# Patient Record
Sex: Female | Born: 1955 | Race: White | Hispanic: No | Marital: Married | Smoking: Never smoker
Health system: Southern US, Community
[De-identification: ages and names within clinical notes are randomized; demographics above are authoritative.]

## PROBLEM LIST (undated history)

## (undated) DIAGNOSIS — K219 Gastro-esophageal reflux disease without esophagitis: Secondary | ICD-10-CM

## (undated) DIAGNOSIS — E039 Hypothyroidism, unspecified: Secondary | ICD-10-CM

## (undated) HISTORY — PX: UPPER GASTROINTESTINAL ENDOSCOPY: SHX188

## (undated) HISTORY — DX: Gastro-esophageal reflux disease without esophagitis: K21.9

---
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
## 1999-06-22 ENCOUNTER — Other Ambulatory Visit: Admission: RE | Admit: 1999-06-22 | Discharge: 1999-06-22 | Payer: Self-pay | Admitting: Gynecology

## 1999-10-07 ENCOUNTER — Emergency Department (HOSPITAL_COMMUNITY): Admission: EM | Admit: 1999-10-07 | Discharge: 1999-10-07 | Payer: Self-pay | Admitting: Emergency Medicine

## 1999-10-08 ENCOUNTER — Encounter: Payer: Self-pay | Admitting: Emergency Medicine

## 1999-10-08 ENCOUNTER — Encounter: Payer: Self-pay | Admitting: *Deleted

## 1999-10-09 ENCOUNTER — Ambulatory Visit (HOSPITAL_COMMUNITY): Admission: RE | Admit: 1999-10-09 | Discharge: 1999-10-09 | Payer: Self-pay | Admitting: Specialist

## 1999-10-09 ENCOUNTER — Encounter: Payer: Self-pay | Admitting: Specialist

## 2000-02-16 ENCOUNTER — Emergency Department (HOSPITAL_COMMUNITY): Admission: EM | Admit: 2000-02-16 | Discharge: 2000-02-16 | Payer: Self-pay | Admitting: Emergency Medicine

## 2000-02-16 ENCOUNTER — Encounter: Payer: Self-pay | Admitting: Emergency Medicine

## 2000-08-02 ENCOUNTER — Other Ambulatory Visit: Admission: RE | Admit: 2000-08-02 | Discharge: 2000-08-02 | Payer: Self-pay | Admitting: Gynecology

## 2000-09-20 HISTORY — PX: WRIST FRACTURE SURGERY: SHX121

## 2000-12-28 ENCOUNTER — Encounter: Payer: Self-pay | Admitting: Family Medicine

## 2000-12-28 ENCOUNTER — Encounter: Admission: RE | Admit: 2000-12-28 | Discharge: 2000-12-28 | Payer: Self-pay | Admitting: Family Medicine

## 2001-10-09 ENCOUNTER — Other Ambulatory Visit: Admission: RE | Admit: 2001-10-09 | Discharge: 2001-10-09 | Payer: Self-pay | Admitting: Gynecology

## 2002-06-11 ENCOUNTER — Ambulatory Visit (HOSPITAL_COMMUNITY): Admission: RE | Admit: 2002-06-11 | Discharge: 2002-06-11 | Payer: Self-pay | Admitting: Internal Medicine

## 2003-05-21 ENCOUNTER — Encounter: Payer: Self-pay | Admitting: *Deleted

## 2003-05-21 ENCOUNTER — Emergency Department (HOSPITAL_COMMUNITY): Admission: EM | Admit: 2003-05-21 | Discharge: 2003-05-22 | Payer: Self-pay | Admitting: *Deleted

## 2003-06-10 ENCOUNTER — Other Ambulatory Visit: Admission: RE | Admit: 2003-06-10 | Discharge: 2003-06-10 | Payer: Self-pay | Admitting: Gynecology

## 2004-09-24 ENCOUNTER — Other Ambulatory Visit: Admission: RE | Admit: 2004-09-24 | Discharge: 2004-09-24 | Payer: Self-pay | Admitting: Gynecology

## 2005-02-08 ENCOUNTER — Encounter: Admission: RE | Admit: 2005-02-08 | Discharge: 2005-02-08 | Payer: Self-pay | Admitting: Internal Medicine

## 2005-02-11 ENCOUNTER — Encounter: Admission: RE | Admit: 2005-02-11 | Discharge: 2005-02-11 | Payer: Self-pay | Admitting: Internal Medicine

## 2006-07-07 ENCOUNTER — Other Ambulatory Visit: Admission: RE | Admit: 2006-07-07 | Discharge: 2006-07-07 | Payer: Self-pay | Admitting: Gynecology

## 2006-07-13 ENCOUNTER — Encounter: Admission: RE | Admit: 2006-07-13 | Discharge: 2006-07-13 | Payer: Self-pay | Admitting: Gynecology

## 2007-01-18 ENCOUNTER — Other Ambulatory Visit: Admission: RE | Admit: 2007-01-18 | Discharge: 2007-01-18 | Payer: Self-pay | Admitting: Gynecology

## 2007-02-08 ENCOUNTER — Ambulatory Visit (HOSPITAL_COMMUNITY): Admission: RE | Admit: 2007-02-08 | Discharge: 2007-02-08 | Payer: Self-pay | Admitting: Otolaryngology

## 2007-03-17 ENCOUNTER — Ambulatory Visit: Payer: Self-pay | Admitting: Cardiovascular Disease

## 2007-03-17 ENCOUNTER — Observation Stay (HOSPITAL_COMMUNITY): Admission: EM | Admit: 2007-03-17 | Discharge: 2007-03-18 | Payer: Self-pay | Admitting: Emergency Medicine

## 2007-03-31 ENCOUNTER — Encounter: Payer: Self-pay | Admitting: Cardiology

## 2007-03-31 ENCOUNTER — Ambulatory Visit: Payer: Self-pay

## 2007-04-13 ENCOUNTER — Encounter: Admission: RE | Admit: 2007-04-13 | Discharge: 2007-04-13 | Payer: Self-pay | Admitting: Internal Medicine

## 2007-04-19 ENCOUNTER — Ambulatory Visit: Payer: Self-pay | Admitting: Cardiology

## 2007-05-15 ENCOUNTER — Ambulatory Visit: Payer: Self-pay | Admitting: Internal Medicine

## 2007-05-23 ENCOUNTER — Ambulatory Visit: Payer: Self-pay | Admitting: Internal Medicine

## 2007-10-04 ENCOUNTER — Other Ambulatory Visit: Admission: RE | Admit: 2007-10-04 | Discharge: 2007-10-04 | Payer: Self-pay | Admitting: Gynecology

## 2007-10-04 ENCOUNTER — Encounter: Admission: RE | Admit: 2007-10-04 | Discharge: 2007-10-04 | Payer: Self-pay | Admitting: Gynecology

## 2007-12-21 DIAGNOSIS — K219 Gastro-esophageal reflux disease without esophagitis: Secondary | ICD-10-CM | POA: Insufficient documentation

## 2007-12-21 DIAGNOSIS — E039 Hypothyroidism, unspecified: Secondary | ICD-10-CM | POA: Insufficient documentation

## 2007-12-21 DIAGNOSIS — J309 Allergic rhinitis, unspecified: Secondary | ICD-10-CM | POA: Insufficient documentation

## 2007-12-21 DIAGNOSIS — E785 Hyperlipidemia, unspecified: Secondary | ICD-10-CM | POA: Insufficient documentation

## 2008-04-02 ENCOUNTER — Encounter: Payer: Self-pay | Admitting: Internal Medicine

## 2008-08-05 IMAGING — CR DG CHEST 1V PORT
1 series · 1 of 1 positions shown · non-contrast
Comparison: none

CLINICAL DATA: Chest pain and shortness of breath.  Emergency Department patient.
 PORTABLE CHEST - 1 VIEW - 03/17/07 AT 5553 HOURS:

[view not recorded]
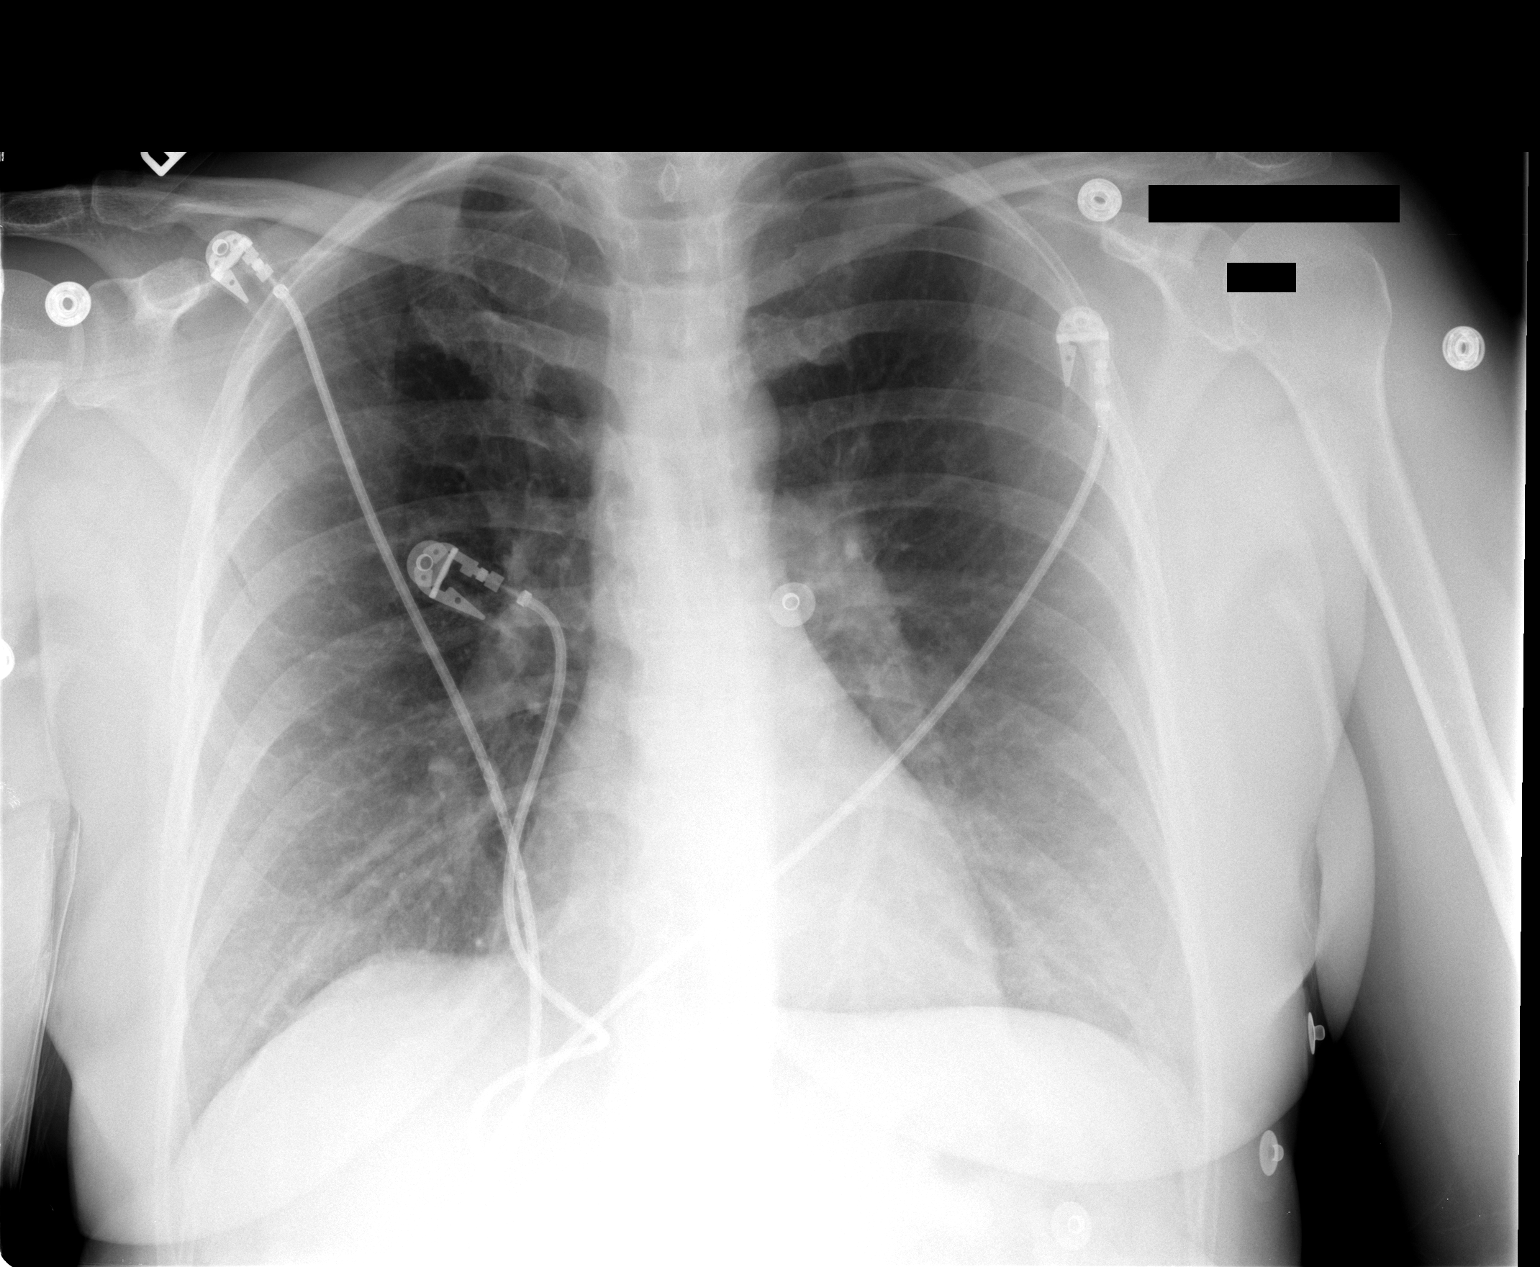

[1 of 1 positions shown; findings below may reference images not displayed]

FINDINGS: The heart size and mediastinal contours are within normal limits.  Both lungs are clear.
IMPRESSION: No acute disease.

## 2009-08-26 ENCOUNTER — Encounter: Admission: RE | Admit: 2009-08-26 | Discharge: 2009-08-26 | Payer: Self-pay | Admitting: Obstetrics and Gynecology

## 2010-05-21 ENCOUNTER — Telehealth: Payer: Self-pay | Admitting: Internal Medicine

## 2010-05-28 ENCOUNTER — Encounter: Payer: Self-pay | Admitting: Internal Medicine

## 2010-09-10 ENCOUNTER — Encounter
Admission: RE | Admit: 2010-09-10 | Discharge: 2010-09-10 | Payer: Self-pay | Source: Home / Self Care | Attending: Gynecology | Admitting: Gynecology

## 2010-10-20 NOTE — Progress Notes (Signed)
  Phone Note Outgoing Call   Call placed by: Milford Cage NCMA,  May 21, 2010 9:25 AM Call placed to: Patient Summary of Call: Called patient and left msg on vmail for her to call the office so I can schedule her follow-up appt. with Dr. Marina Goodell.  I will give her refills once that appt. is scheduled.  She is also due for a screening colonoscopy.   Initial call taken by: Milford Cage NCMA,  May 21, 2010 9:26 AM  Follow-up for Phone Call        attempted to call patient again to schedule follow-up.  No answer/left message again on voicemail for her to call office.  Milford Cage [facility]  May 27, 2010 9:07 AM Letter sent out to patient to call the office to set up appt. with Dr. Marina Goodell.   Follow-up by: Milford Cage NCMA,  May 28, 2010 1:52 PM

## 2010-10-20 NOTE — Letter (Signed)
Summary: Appointment Reminder  [facility]  [address] [place], [place] [address]   Phone: [phone]  Fax: [phone]        May 28, 2010 MRN: [id number]    [facility] [address] [place], [place]  [address]    Dear Ms. Ayala,   We have been unable to reach you by phone to schedule a follow up   appointment that was recommended for you by Dr. Marina Goodell. It is very   important that we reach you to schedule an appointment. We hope that you  allow [place] to participate in your health care needs. Please contact us at  [phone] at your earliest convenience to schedule your appointment.     Sincerely,    Milford Cage NCMA/ Wilhemina Bonito. Marina Goodell, MD

## 2011-02-02 NOTE — Assessment & Plan Note (Signed)
[facility]                         GASTROENTEROLOGY OFFICE NOTE   NAME:[name], [name]                      MRN:          [id number]  DATE:05/15/2007                            DOB:          [birth year]    OFFICE CONSULTATION NOTE:   REASON FOR CONSULTATION:  Chronic chest pain and dysphagia.   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 Timothy Lasso regarding chest complaints felt possibly  gastrointestinal in nature.  The patient reports a 4-5 year history of  focal chest pain in the left lower sternum.  She describes some  radiation into the back with a tightness sensation in the back muscles.  Also increased belching.  She also reports chronic heartburn and  indigestion.  Over the past few years, she has had a 35-40 pound weight  gain.  She also mentions a 4-5 year history of constipation alternating  with diarrhea as well as bloating and gas.  No melena or hematochezia.  She was hospitalized on March 17, 2007 for chest pain.  Negative cardiac  workup.  She was placed on Protonix but has been noncompliant.  She does  state that she has improved her diet, which has led to less in the way  of symptoms.  With regards to swallowing, she describes a sensation of  her throat closing.  As well, coughing or choking spells.  Problems seem  worse with liquids.   PAST MEDICAL HISTORY:  1. Hypothyroidism.  2. Dyslipidemia.   PAST SURGICAL HISTORY:  1. Tonsillectomy.  2. Right wrist surgery.   ALLERGIES:  MACRODANTIN.   CURRENT MEDICATIONS:  1. Vytorin 20/40 mg daily.  2. Synthroid 75 mcg daily.  3. Protonix 40 mg daily, prescribed but not being taken.   FAMILY HISTORY:  Father with ulcer disease as well as esophageal  stricture requiring esophageal dilation.  No history of gastrointestinal  malignancy.   SOCIAL HISTORY:  The patient is  married.  She is accompanied by her  spouse.  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  She quit smoking in 2000.  She  occasionally drinks alcohol.   REVIEW OF SYSTEMS:  Per diagnostic evaluation form.   PHYSICAL EXAMINATION:  GENERAL:  A well-appearing female in no acute  distress.  Her blood pressure is 110/72, heart rate 72, weight is 175 pounds.  She  is 5 feet 7 inches in height.  HEENT:  Sclerae are anicteric.  Conjunctivae are pink.  Oral mucosa is  intact.  No superficial.  LUNGS:  Clear.  HEART:  Regular.  ABDOMEN:  Soft without tenderness, mass, or hernia.  Good bowel sounds  heard.  EXTREMITIES:  Without edema.   X-RAY FINDINGS:  An abdominal ultrasound obtained on April 13, 2007 was  normal.  No evidence of gallstones.   LABORATORY:  Blood work obtained on March 17, 2007 was unremarkable,  including CBC and  basic chemistries.   IMPRESSION:  1. Chronic gastroesophageal reflux disease.  This may explain her      problems with nausea, pharyngeal complaints, and chest pain.  2. Dysphagia.  Seems to be worse with liquids.  Possible esophageal      dysmotility.  3. Chronic alternating bowel habits with gas and bloating, consistent      with irritable bowel.   RECOMMENDATIONS:  1. Advised to take proton pump inhibitor daily.  2. Schedule upper endoscopy to evaluate GI complaints, including chest      pain and dysphagia, as well as reflux.  3. Strict adherence to reflux diet.  4. Would be due for screening colonoscopy in one year.  Discussed with      patient.  5. Ongoing general medical care with Dr. Timothy Lasso.     Wilhemina Bonito. Marina Goodell, MD  Electronically Signed    JNP/MedQ  DD: 05/15/2007  DT: 05/15/2007  Job #: [id number]   cc:   Gwen Pounds, MD  Luis Abed, MD, [facility]

## 2011-02-02 NOTE — Assessment & Plan Note (Signed)
[facility]                            CARDIOLOGY OFFICE NOTE   NAME:[name], [name]                      MRN:          [id number]  DATE:04/19/2007                            DOB:          January 04, 1956    [name] is seen post hospitalization.  She is doing well.  She is  the wife of Sarina Ser who I have known for many, many years.  She  recently had some chest discomfort and she was admitted to [facility].  While there, she stabilized.  She did have a stress  Myoview scan while in the hospital.  It was normal and she was  discharged.  She is seeing me back.  She has had an echocardiogram.  She  has been taking Protonix.  In the meantime, she has also seen Dr. Timothy Lasso  who is arranging for further GI evaluation for her.  Overall, she is  feeling well.  She seems reassured that we think her cardiac status is  stable.   ALLERGIES:  MACRODANTIN.   MEDICATIONS:  1. Protonix.  2. Vytorin 20/40 ?, probably 10/40.  3. Synthroid 75 mcg daily.   OTHER MEDICAL PROBLEMS:  See the list below.   REVIEW OF SYSTEMS:  She is feeling well today.  Review of systems  otherwise is negative.   PHYSICAL EXAMINATION:  VITAL SIGNS:  Blood pressure 118/86 with a pulse  of 59.  GENERAL APPEARANCE:  The patient is oriented to person, time and place  and her affect is normal.  HEENT:  No xanthelasma.  She has normal extraocular motion.  NECK:  There are no carotid bruits.  There is no jugular venous  distension.  LUNGS:  Clear.  Respiratory effort is not labored.  CARDIOVASCULAR:  S1 with an S2.  There are no clicks or significant  murmurs.  ABDOMEN:  She has normal bowel sounds.  EXTREMITIES:  There is no peripheral edema.   Her EKG is normal.  The patient also did have a 2-D echo.  The study  shows that she has only slight thickening of her mitral valve.  There is  no definite mitral valve prolapse.  She has normal wall motion and her  ejection fraction is 55-60%.   IMPRESSION:  1. Normal left ventricular function.  2. Very slight thickening of the mitral valve with no significant      abnormalities. (There was question that she carried a diagnosis      from the past of mitral valve prolapse based on physical      examination but she does not have this by echo.)  3. No evidence of ischemia by Myoview scan.  4. History of hypercholesterolemia.  5. History of hypothyroidism being treated.  6. Gastroesophageal reflux disease.  We talked about weight loss and      diet and being sure that she does not lie down after eating.   Her overall cardiac status is stable.  She needs no further cardiac work-  up.  Dr. Timothy Lasso will be following her and will do an excellent job of  overseeing all of her medical problems.  I will be happy to see her back  if needed.     Luis Abed, MD, [facility]  Electronically Signed    JDK/MedQ  DD: 04/19/2007  DT: 04/20/2007  Job #: [id number]   cc:   Gwen Pounds, MD

## 2011-02-02 NOTE — H&P (Signed)
[name], [name]               ACCOUNT NO.:  [id number]   MEDICAL RECORD NO.:  [id number]          PATIENT TYPE:  INP   LOCATION:  4705                         FACILITY:  [facility]   PHYSICIAN:  Ana Pick. Eden Emms, MD, FACCDATE OF BIRTH:  [birth year]   DATE OF ADMISSION:  03/17/2007  DATE OF DISCHARGE:  03/18/2007                              HISTORY & PHYSICAL   PRIMARY CARDIOLOGIST:  Ana Arista C. Eden Emms, MD, [facility].   PRIMARY CARE PHYSICIAN:  Ana Pounds, MD.   HISTORY OF PRESENT ILLNESS:  This 55 year old Caucasian female, with no  prior history of coronary artery disease, but with a history of  hypercholesterolemia and hypothyroidism comes in with complaints of  chest discomfort.  Approximately, over the last three days, the patient  has been having heartburn symptoms which have been unrelieved with  Mylanta.  The patient states that, approximately three days ago, she had  severe heartburn lasting on and off all day and then felt like her  throat and her stomach were scaled.  The patient states that Mylanta  helped some but did not alleviate.  The following day she felt some  better; yesterday, before admission, she had severe chest discomfort  which returned, was midsternal, more severe, felt more pressure-like as  opposed to burning, worsened with activity.  She could not get her  breath very well.  Today, the pain again returned.  She came to the  emergency room at the insistence of her husband.  The patient was given  one sublingual nitroglycerine and the pain was completely resolved.  The  patient said she has never felt pain like that before.  She has had  different bouts of epigastric discomfort in the past but none as severe  as the last few days.  The patient also admits to some heavy lifting and  moving some furniture approximately two weeks ago and has had some  complaints of neck and shoulder pain, although the pain that she is  describing with the epigastric pain did not  radiate.   REVIEW OF SYSTEMS:  Positive for:  1. RESPIRATORY:  Chest discomfort and trouble taking a deep breath;      otherwise, negative.  2. HEENT:  The patient has also had some problems with swallowing, on      and off, over the last couple of years and had symptoms and GERD;      otherwise, negative.   PAST MEDICAL HISTORY:  1. Hypercholesterolemia.  2. Hypothyroidism.   PAST SURGICAL HISTORY:  1. Tonsillectomy.  2. Right arm repair secondary to a fracture.   SOCIAL HISTORY:  The patient lives in [place] with her husband.  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  She is married, has one son.  She does not drink,  she does not smoke; very little exercise.  No over medicine use.   FAMILY HISTORY:  Mother is healthy.  Father died of cancer.  There is  also a history of hypercholesterolemia.  Her grandmothers on her  paternal side had MIs.  CURRENT MEDICATIONS:  At home:  1. Vytorin 10/40 once a day.  2. Synthroid 75 mcg one a day.   ALLERGIES:  MACRODANTIN.   CURRENT LABS:  Sodium 137, potassium 5.5, chloride 110, CO2 34, BUN 13,  creatinine 0.8, glucose 77, hemoglobin 15.3, hematocrit 45.  D-dimer  0.22.  EKG was only normal sinus rhythm, ventricular rate is 74 beats  per minute without evidence of ischemia seen.   PHYSICAL EXAMINATION:  CURRENT VITAL SIGNS:  Blood pressure 130/76,  pulse 86, respirations 20, temperature 98.3.  O2 saturation 97% on room  air.  HEENT: Head is normocephalic and atraumatic.  Eyes:  PERRLA.  The  patient wears glasses.  Mucous membranes of the mouth are pink and  moist.  The tongue is midline.  NECK:  There is no thyromegaly, no  carotid bruit or JVD appreciated.  CARDIOVASCULAR:  Regular rate and rhythm with 1/6 systolic murmur.  Pulses are 2+ and equal.  There are no bruits appreciated.  ABDOMEN:  Soft, nontender with 2+ bowel sounds.  No organomegaly or  rebound or guarding  noted.  LUNGS:  Are clear to auscultation.  EXTREMITIES:  Without clubbing, cyanosis, or edema.  NEURO:  Cranial nerves II-XII are grossly intact with all extremities  moving and equal strength.   Chest x-ray is pending.  Point-of-care marker:  Troponin 0.05, myoglobin  41.8, CK-MB less than 1.   IMPRESSION:  1. Chest pain, rule out cardiac etiology versus gastrointestinal.  2. Hyperlipidemia.  3. Hypothyroidism.   PLAN:  This is a 55 year old Caucasian female patient with no prior  cardiac history with complaints of heartburn and chest discomfort over  the last three days.  The patient will be admitted to rule out  myocardial ischemia and etiology for chest discomfort versus GI origin.  If cardiac enzymes are found to be negative, the patient will be planned  for a stress Myoview in the morning.  We will add proton pump inhibitor  to medication regimen and monitor cardiac enzymes.  The patient does  have some mild nonspecific ST changes in her V5, V6 per Dr. Eden James.  We  are going to do the inpatient Myoview prior to discharge.  If negative,  the patient will follow up as an outpatient with Dr. Eden James and an  echocardiogram will be completed to evaluate her systolic murmur.      Ana Mare. Lyman Bishop, NP      Ana Pick. Eden Emms, MD, [facility]  Electronically Signed    KML/MEDQ  D:  03/17/2007  T:  03/18/2007  Job:  [id number]

## 2011-02-02 NOTE — Discharge Summary (Signed)
[name], [name]               ACCOUNT NO.:  [id number]   MEDICAL RECORD NO.:  [id number]          PATIENT TYPE:  INP   LOCATION:  4705                         FACILITY:  [facility]   PHYSICIAN:  Learta Codding, MD,FACC DATE OF BIRTH:  [birth year]   DATE OF ADMISSION:  03/17/2007  DATE OF DISCHARGE:  03/18/2007                               DISCHARGE SUMMARY   PRIMARY CARE PHYSICIAN:  Dr. Timothy Lasso   PRIMARY CARDIOLOGIST:  Dr. Willa Rough   PROCEDURES PERFORMED DURING HOSPITALIZATION:  1. Stress Myoview.        A:  No evidence of ischemia, reversible or fixed, with normal  perfusion scan, EF of 78% per Dr. Dahlia Client, radiologist.   DISCHARGE DIAGNOSES:  1. Atypical chest pain, probable gastrointestinal origin.  2. History of hypercholesterolemia.  3. Hypothyroidism.   HISTORY OF PRESENT ILLNESS:  This is a 55 year old very pleasant  Caucasian female who has had frequent episodes of heartburn on-and-off  for the last three days prior to admission.  Mylanta did not give  relief.  The pain felt uncomfortable and was found to be in the mid-  chest with esophageal burning.  The pain lasted all day long and she  described it as a scalding in her throat to her stomach.  The pain was  some better the following day, but then the day before admission she had  severe chest discomfort which returned, midsternal, worsening with  activity and felt like pressure.  She did have some trouble breathing  and felt a tightness midsternally.  Nitroglycerin in the emergency room  relieved the pain completely.  The patient had never felt pain like that  before, however she had been having evidence of heartburn on-and-off  over the last year.  The patient subsequently was admitted to rule out  myocardial infarction and cardiac etiology for chest discomfort.   The patient was not placed on any heparin or nitroglycerin, as her pain  had resolved somewhat with the sublingual nitroglycerin.  She had a  subsequent  stress Myoview the morning of March 18, 2007 which was normal  revealing no evidence of ischemia.  The patient's chest discomfort was  significantly reduced and she was found to be stable for discharge.  She  was seen and examined by Dr. Andee Lineman on day of discharge.  The patient is  to followup with Dr. Jerral Bonito for an echocardiogram and continued  management with response to proton pump inhibitor which is prescribed on  discharge and for any other interventions he deemed necessary.   DISCHARGE LABS:  Hemoglobin 13.6, hematocrit 39.7, white blood cells  12.9, platelets 422.  Sodium 137, potassium 3.9, chloride 109, CO2 22,  glucose 86, BUN 10, creatinine 0.65.  Troponins are negative x2 at 0.02,  0.01.  CK-MB 0.9 and 0.9 with a CK total of 40 and 53, respectively.  Cholesterol was elevated at 224, triglycerides 107, HDL 61, LDL 142.  Urinalysis found to be positive for UTI.  D-dimer was negative at 0.22.   EKG revealed normal sinus rhythm without evidence of ischemia seen.   VITAL SIGNS ON  DISCHARGE:  Blood pressure 123/80, heart rate 81,  respirations 26.   DISCHARGE MEDICATIONS:  1. Protonix 40 mg once a day.  2. Vytorin 20/40 at bedtime.  3. Synthroid 75 mcg daily.  4. Cipro 250 mg b.i.d. x10 days.   ALLERGIES:  MACRODANTIN.   FOLLOWUP PLANS AND APPOINTMENTS:  1. The patient will be scheduled for echocardiogram in the office at      [facility] for further evaluation with a history of      hypothyroidism and palpitations.  2. The patient will be followed by Dr. Myrtis Ser after echocardiogram for      continuation of cardiac evaluation and patient's response to      Protonix.  3. The patient will continue with her primary care physician, Dr.      Timothy Lasso, for any further medical management.   Time spent with the patient to include physician's time:  45 minutes.      Bettey Mare. Lyman Bishop, NP      Learta Codding, MD,FACC  Electronically Signed    KML/MEDQ  D:   03/18/2007  T:  03/19/2007  Job:  [id number]

## 2011-06-30 ENCOUNTER — Encounter: Payer: Self-pay | Admitting: Internal Medicine

## 2011-07-07 LAB — URINE MICROSCOPIC-ADD ON

## 2011-07-07 LAB — CBC
HCT: 39.7
Hemoglobin: 13.6
MCV: 92.1
Platelets: 422 — ABNORMAL HIGH
RDW: 13.2

## 2011-07-07 LAB — I-STAT 8, (EC8 V) (CONVERTED LAB)
Acid-base deficit: 1
BUN: 13
Bicarbonate: 23.2
Glucose, Bld: 77
Hemoglobin: 15.3 — ABNORMAL HIGH
TCO2: 24
pCO2, Ven: 34.9 — ABNORMAL LOW
pH, Ven: 7.431 — ABNORMAL HIGH

## 2011-07-07 LAB — BASIC METABOLIC PANEL
BUN: 10
GFR calc Af Amer: >60
GFR calc non Af Amer: >60
Glucose, Bld: 86
Potassium: 3.9
Sodium: 137

## 2011-07-07 LAB — URINALYSIS, ROUTINE W REFLEX MICROSCOPIC
Bilirubin Urine: NEGATIVE
Glucose, UA: NEGATIVE
Ketones, ur: NEGATIVE
Nitrite: NEGATIVE
Specific Gravity, Urine: 1.019
pH: 5.5

## 2011-07-07 LAB — LIPID PANEL
LDL Cholesterol: 142 — ABNORMAL HIGH
Triglycerides: 107
VLDL: 21

## 2011-07-07 LAB — DIFFERENTIAL
Eosinophils Absolute: 0.2
Lymphs Abs: 2.6
Monocytes Absolute: 0.6
Monocytes Relative: 5
Neutro Abs: 9.3 — ABNORMAL HIGH
Neutrophils Relative %: 72

## 2011-07-07 LAB — CK TOTAL AND CKMB (NOT AT ARMC): Relative Index: INVALID

## 2011-07-07 LAB — POCT CARDIAC MARKERS
CKMB, poc: <1 — ABNORMAL LOW
Myoglobin, poc: 41.8
Myoglobin, poc: 47.7
Operator id: 288831
Troponin i, poc: <0.05

## 2011-07-07 LAB — CARDIAC PANEL(CRET KIN+CKTOT+MB+TROPI)
CK, MB: 0.9
Relative Index: INVALID
Total CK: 53

## 2011-07-07 LAB — H. PYLORI ANTIBODY, IGG: H Pylori IgG: 0.5

## 2011-07-07 LAB — TROPONIN I: Troponin I: 0.02

## 2011-08-10 ENCOUNTER — Other Ambulatory Visit: Payer: Self-pay | Admitting: Internal Medicine

## 2011-08-27 ENCOUNTER — Emergency Department (HOSPITAL_COMMUNITY): Payer: BC Managed Care – PPO

## 2011-08-27 ENCOUNTER — Other Ambulatory Visit: Payer: Self-pay

## 2011-08-27 ENCOUNTER — Encounter: Payer: Self-pay | Admitting: Physical Medicine and Rehabilitation

## 2011-08-27 ENCOUNTER — Emergency Department (HOSPITAL_COMMUNITY)
Admission: EM | Admit: 2011-08-27 | Discharge: 2011-08-28 | Disposition: A | Discharge status: Home / Self Care | Payer: BC Managed Care – PPO | Attending: Emergency Medicine | Admitting: Emergency Medicine

## 2011-08-27 DIAGNOSIS — H579 Unspecified disorder of eye and adnexa: Secondary | ICD-10-CM

## 2011-08-27 DIAGNOSIS — R42 Dizziness and giddiness: Secondary | ICD-10-CM | POA: Insufficient documentation

## 2011-08-27 DIAGNOSIS — M79609 Pain in unspecified limb: Secondary | ICD-10-CM | POA: Insufficient documentation

## 2011-08-27 DIAGNOSIS — I10 Essential (primary) hypertension: Secondary | ICD-10-CM | POA: Insufficient documentation

## 2011-08-27 DIAGNOSIS — Z79899 Other long term (current) drug therapy: Secondary | ICD-10-CM | POA: Insufficient documentation

## 2011-08-27 DIAGNOSIS — R05 Cough: Secondary | ICD-10-CM | POA: Insufficient documentation

## 2011-08-27 DIAGNOSIS — R110 Nausea: Secondary | ICD-10-CM | POA: Insufficient documentation

## 2011-08-27 DIAGNOSIS — R059 Cough, unspecified: Secondary | ICD-10-CM | POA: Insufficient documentation

## 2011-08-27 DIAGNOSIS — E039 Hypothyroidism, unspecified: Secondary | ICD-10-CM | POA: Insufficient documentation

## 2011-08-27 DIAGNOSIS — H538 Other visual disturbances: Secondary | ICD-10-CM | POA: Insufficient documentation

## 2011-08-27 HISTORY — DX: Hypothyroidism, unspecified: E03.9

## 2011-08-27 LAB — PROTIME-INR
INR: 1.03 (ref 0.00–1.49)
Prothrombin Time: 13.7 seconds (ref 11.6–15.2)

## 2011-08-27 LAB — DIFFERENTIAL
Lymphocytes Relative: 33 % (ref 12–46)
Lymphs Abs: 3 10*3/uL (ref 0.7–4.0)
Monocytes Absolute: 0.6 10*3/uL (ref 0.1–1.0)
Monocytes Relative: 7 % (ref 3–12)
Neutro Abs: 5.2 10*3/uL (ref 1.7–7.7)
Neutrophils Relative %: 56 % (ref 43–77)

## 2011-08-27 LAB — BASIC METABOLIC PANEL
BUN: 16 mg/dL (ref 6–23)
CO2: 25 mEq/L (ref 19–32)
Chloride: 104 mEq/L (ref 96–112)
Creatinine, Ser: 0.69 mg/dL (ref 0.50–1.10)
GFR calc Af Amer: >90 mL/min (ref >90)
Glucose, Bld: 104 mg/dL — ABNORMAL HIGH (ref 70–99)
Potassium: 4.7 mEq/L (ref 3.5–5.1)

## 2011-08-27 LAB — CBC
HCT: 39.5 % (ref 36.0–46.0)
Hemoglobin: 13.5 g/dL (ref 12.0–15.0)
RBC: 4.33 MIL/uL (ref 3.87–5.11)

## 2011-08-27 MED ORDER — ACETAMINOPHEN 325 MG PO TABS
650.0000 mg | ORAL_TABLET | Freq: Once | ORAL | Status: AC
  Administered 2011-08-27: 650 mg via ORAL
  Filled 2011-08-27: qty 2

## 2011-08-27 NOTE — ED Notes (Signed)
Pt presents to department for evaluation of L arm numbness, nausea, lightheadedness and fatigue. Onset today. Pt states she is stressed out from work. Also states elevated blood pressure this afternoon. Skin warm and dry. States 5/10 L arm soreness at the time. Denies actual chest pain. Respirations unlabored. No signs of distress at the time.

## 2011-08-27 NOTE — ED Notes (Signed)
pts family updated on delay.  

## 2011-08-27 NOTE — ED Notes (Signed)
PA at bedside.

## 2011-08-27 NOTE — ED Provider Notes (Signed)
History     CSN: [id number] Arrival date & time: 08/27/2011  7:14 PM   First MD Initiated Contact with Patient 08/27/11 2230      Chief Complaint  Patient presents with  . Hypertension  . Nausea    (Consider location/radiation/quality/duration/timing/severity/associated sxs/prior treatment) HPI Comments: Patient reports she was shopping when she noticed bright white sparkling lights in the right periphery of her vision.  This occurred for approximately 10-15 minutes and spontaneously resolved.  Denies any blurry or double vision.  States she became very scared and checked her blood pressure, which was 190/106, and she then became lightheaded and queasy.  Once she got to her car she realized that her left arm was sore in the area of her biceps.  States there were no exacerbating or palliative factors.  Denies and CP, SOB, palpitations, weakness or numbness of the extremities.  Patient notes she has been doing heavy lifting recently.  Has no existing problems with her except for needing glasses for far sightedness.    Patient is a 55 y.o. female presenting with hypertension. The history is provided by the patient.  Hypertension    Past Medical History  Diagnosis Date  . Hypothyroid     No past surgical history on file.  No family history on file.  History  Substance Use Topics  . Smoking status: Never Smoker   . Smokeless tobacco: Not on file  . Alcohol Use: No    OB History    Grav Para Term Preterm Abortions TAB SAB Ect Mult Living                  Review of Systems  Allergies  Macrodantin  Home Medications   Current Outpatient Rx  Name Route Sig Dispense Refill  . LEVOTHYROXINE SODIUM 100 MCG PO TABS Oral Take 100 mcg by mouth daily.        BP 166/92  Pulse 89  Temp(Src) 97.6 F (36.4 C) (Oral)  Resp 19  SpO2 97%  Physical Exam  Constitutional: She is oriented to person, place, and time. She appears well-developed and well-nourished.  HENT:  Head:  Normocephalic and atraumatic.  Eyes: Conjunctivae and EOM are normal. Pupils are equal, round, and reactive to light. No scleral icterus.  Neck: Neck supple.  Cardiovascular: Normal rate, regular rhythm and normal heart sounds.   Pulmonary/Chest: Breath sounds normal. No respiratory distress. She has no wheezes. She has no rales. She exhibits no tenderness.  Abdominal: Soft. Bowel sounds are normal. There is no tenderness.  Musculoskeletal:       Strength 5/5 throughout, distal pulses intact.    Neurological: She is alert and oriented to person, place, and time. She has normal strength. No cranial nerve deficit or sensory deficit. Coordination normal.       Grip strengths normal and equal  Skin: No rash noted.  Psychiatric: She has a normal mood and affect. Her behavior is normal. Thought content normal.    ED Course  Procedures (including critical care time) 10:53 PM Patient seen and examined, discussed with Dr Weldon Inches.  Labs Reviewed  BASIC METABOLIC PANEL - Abnormal; Notable for the following:    Glucose, Bld 104 (*)    All other components within normal limits  PROTIME-INR  CBC  DIFFERENTIAL  POCT I-STAT TROPONIN I  I-STAT TROPONIN I   Dg Chest Port 1 View  08/27/2011  *RADIOLOGY REPORT*  Clinical Data: Cough.  Renal failure.  Blurry vision.  PORTABLE CHEST - 1 VIEW  Comparison: 03/17/2007  Findings: The heart size and pulmonary vascularity are normal. The lungs appear clear and expanded without focal air space disease or consolidation. No blunting of the costophrenic angles.  No significant change since previous study.  IMPRESSION: No evidence of active pulmonary disease.  Original Report Authenticated By: Marlon Pel, M.D.   11:58 PM Discussed all results with patient.  Patient has seen Sol Blazing, opthalmologist.  Patient has had no recurrence of symptoms.  Plan is for d/c home with opthalmology follow up.     Date: 08/28/2011  Rate: 83  Rhythm: normal sinus rhythm   QRS Axis: normal  Intervals: normal  ST/T Wave abnormalities: normal  Conduction Disutrbances:none  Narrative Interpretation:   Old EKG Reviewed: unchanged    1. Visual complaint       MDM  Patient with short period of white flashing lights in visual field, resolved prior to arrival in ED.  Patient is chronically far sighted otherwise no visual problems.  Pt came in with other associated complaints, likely due to the admitted anxiety caused by visual disturbance.  Patient had no other visual complaints or changes and no neurological deficits.  Patient has ophthalmologist she can follow up with on Monday.           Rise Patience, PA 08/28/11 0021  Rise Patience, [place] 08/28/11 [phone]

## 2011-08-27 NOTE — ED Notes (Signed)
EKG completed in triage.

## 2011-08-28 NOTE — ED Provider Notes (Signed)
Medical screening examination/treatment/procedure(s) were performed by non-physician practitioner and as supervising physician I was immediately available for consultation/collaboration.  Nicholes Stairs, MD 08/28/11 [phone]

## 2011-11-05 ENCOUNTER — Other Ambulatory Visit: Payer: Self-pay | Admitting: Internal Medicine

## 2011-11-05 DIAGNOSIS — Z1231 Encounter for screening mammogram for malignant neoplasm of breast: Secondary | ICD-10-CM

## 2011-11-19 ENCOUNTER — Ambulatory Visit
Admission: RE | Admit: 2011-11-19 | Discharge: 2011-11-19 | Disposition: A | Discharge status: Home / Self Care | Payer: BC Managed Care – PPO | Source: Ambulatory Visit | Attending: Internal Medicine | Admitting: Internal Medicine

## 2011-11-19 ENCOUNTER — Other Ambulatory Visit: Payer: Self-pay | Admitting: Obstetrics & Gynecology

## 2011-11-19 DIAGNOSIS — Z13820 Encounter for screening for osteoporosis: Secondary | ICD-10-CM

## 2011-11-19 DIAGNOSIS — Z1231 Encounter for screening mammogram for malignant neoplasm of breast: Secondary | ICD-10-CM

## 2013-01-15 IMAGING — CR DG CHEST 1V PORT
1 series · 1 of 1 positions shown · non-contrast
Comparison: 03/17/2007

CLINICAL DATA: Cough.  Renal failure.  Blurry vision.

PORTABLE CHEST - 1 VIEW

[AP]
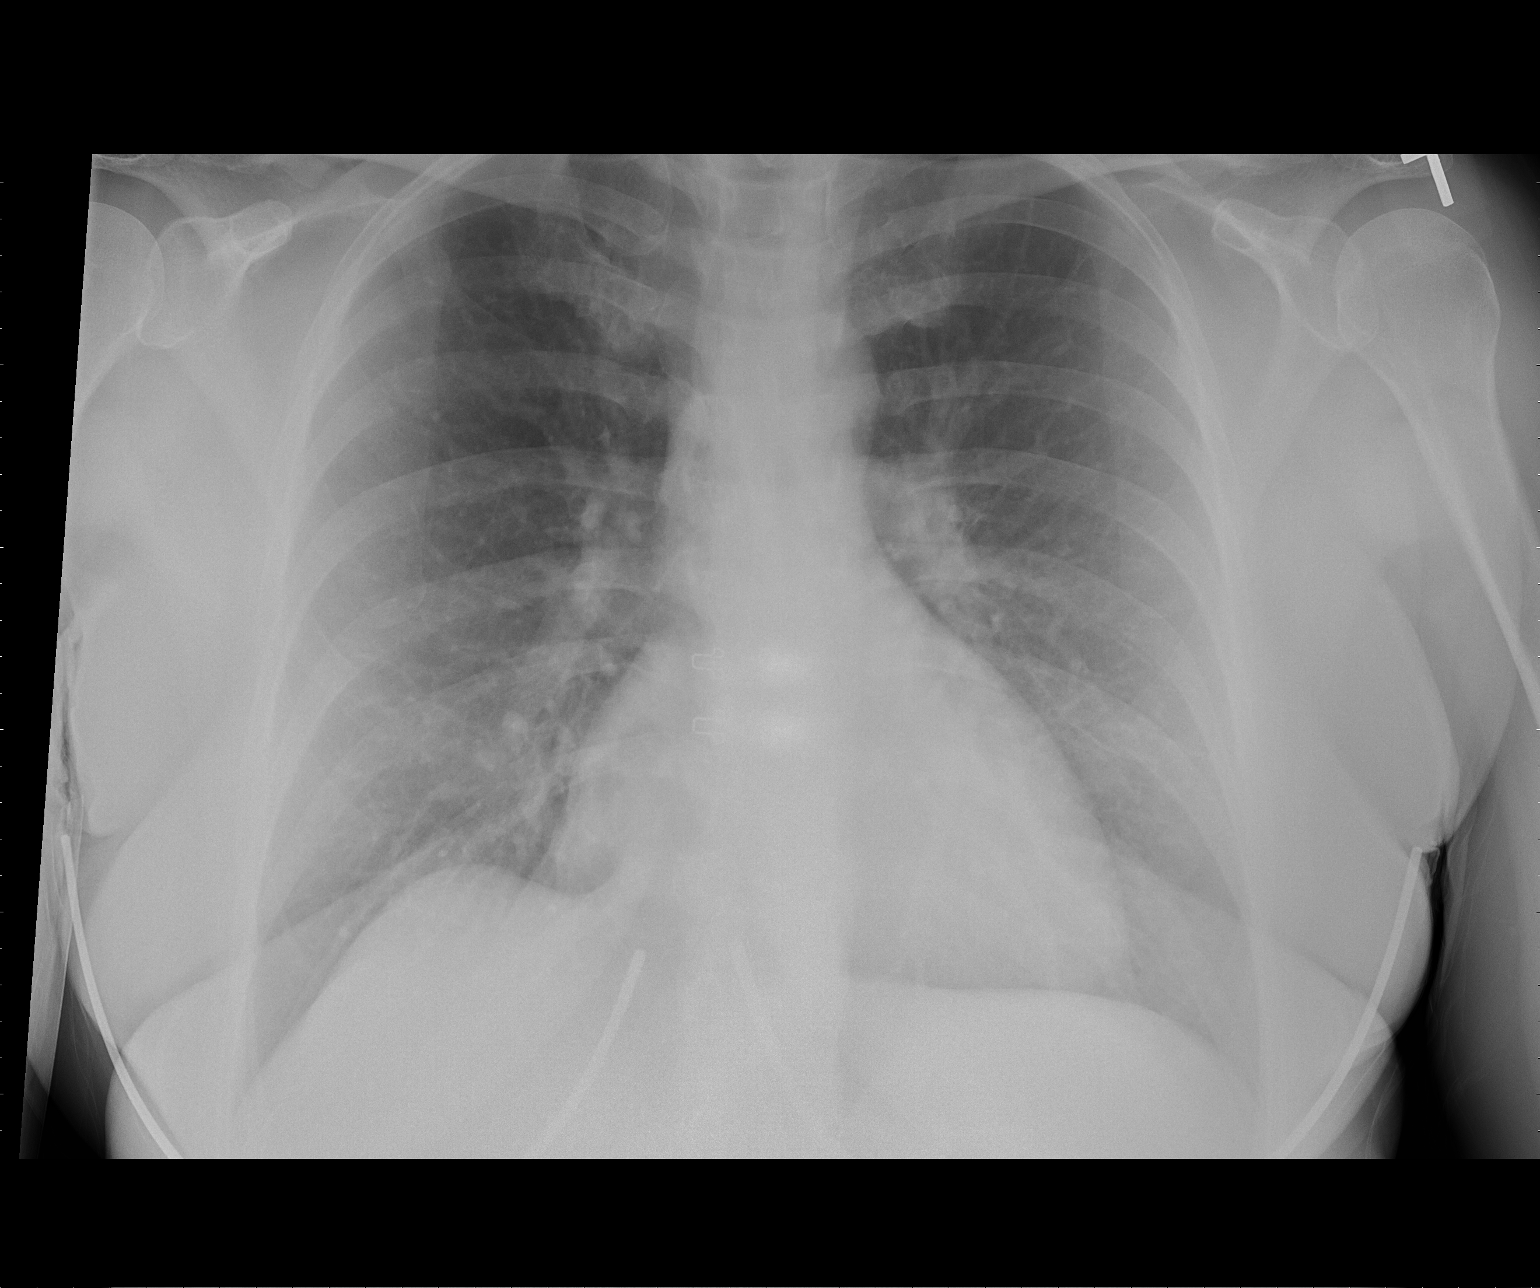

[1 of 1 positions shown; findings below may reference images not displayed]

FINDINGS: The heart size and pulmonary vascularity are normal. The
lungs appear clear and expanded without focal air space disease or
consolidation. No blunting of the costophrenic angles.  No
significant change since previous study.
IMPRESSION: No evidence of active pulmonary disease.

## 2013-08-29 ENCOUNTER — Encounter: Payer: Self-pay | Admitting: Internal Medicine

## 2013-10-04 ENCOUNTER — Encounter (HOSPITAL_COMMUNITY): Payer: Self-pay | Admitting: Emergency Medicine

## 2013-10-04 ENCOUNTER — Emergency Department (HOSPITAL_COMMUNITY)
Admission: EM | Admit: 2013-10-04 | Discharge: 2013-10-04 | Disposition: A | Discharge status: Home / Self Care | Payer: BC Managed Care – PPO | Attending: Emergency Medicine | Admitting: Emergency Medicine

## 2013-10-04 DIAGNOSIS — Z79899 Other long term (current) drug therapy: Secondary | ICD-10-CM | POA: Insufficient documentation

## 2013-10-04 DIAGNOSIS — E039 Hypothyroidism, unspecified: Secondary | ICD-10-CM | POA: Insufficient documentation

## 2013-10-04 DIAGNOSIS — R002 Palpitations: Secondary | ICD-10-CM | POA: Insufficient documentation

## 2013-10-04 LAB — POCT I-STAT, CHEM 8
BUN: 18 mg/dL (ref 6–23)
CALCIUM ION: 1.17 mmol/L (ref 1.12–1.23)
CHLORIDE: 104 meq/L (ref 96–112)
CREATININE: 0.8 mg/dL (ref 0.50–1.10)
GLUCOSE: 127 mg/dL — AB (ref 70–99)
HEMATOCRIT: 45 % (ref 36.0–46.0)
HEMOGLOBIN: 15.3 g/dL — AB (ref 12.0–15.0)
Potassium: 4.8 mEq/L (ref 3.7–5.3)
Sodium: 140 mEq/L (ref 137–147)
TCO2: 25 mmol/L (ref 0–100)

## 2013-10-04 LAB — TSH: TSH: 3.722 u[IU]/mL (ref 0.350–4.500)

## 2013-10-04 NOTE — ED Provider Notes (Signed)
CSN: [id number]     Arrival date & time 10/04/13  3903 History   First MD Initiated Contact with Patient 10/04/13 [phone]     Chief Complaint  Patient presents with  . Palpitations   (Consider location/radiation/quality/duration/timing/severity/associated sxs/prior Treatment) HPI Comments: 58 year old female, history of hypothyroidism presents with a complaint of racing heartbeat. She states that this started last night after she ate dinner and lay down for bed at 10:30 PM. It persisted for at least 4 hours before it resolved spontaneously. She states that she had a distinct feeling that her heart beat was beating fast and hard but denies chest pain, shortness of breath, fever, chills, cough, abdominal pain, back pain, rashes, swelling. She denies excessive caffeine intake, she has been using some over-the-counter medications for nonspecific upper respiratory symptoms including Coricidin yesterday morning. She did have 2 glasses of wine with dinner last night, she states that she usually drinks very very little. She has never had these palpitations before, she denies any prior heart injuries or disease  Patient is a 58 y.o. female presenting with palpitations. The history is provided by the patient and the spouse.  Palpitations   Past Medical History  Diagnosis Date  . Hypothyroid    History reviewed. No pertinent past surgical history. No family history on file. History  Substance Use Topics  . Smoking status: Never Smoker   . Smokeless tobacco: Not on file  . Alcohol Use: No   OB History   Grav Para Term Preterm Abortions TAB SAB Ect Mult Living                 Review of Systems  Cardiovascular: Positive for palpitations.  All other systems reviewed and are negative.    Allergies  Macrodantin  Home Medications   Current Outpatient Rx  Name  Route  Sig  Dispense  Refill  . levothyroxine (SYNTHROID, LEVOTHROID) 112 MCG tablet   Oral   Take 112 mcg by mouth daily before  breakfast.          BP 145/85  Pulse 97  Temp(Src) 97.9 F (36.6 C) (Oral)  Resp 12  SpO2 99% Physical Exam  Nursing note and vitals reviewed. Constitutional: She appears well-developed and well-nourished. No distress.  HENT:  Head: Normocephalic and atraumatic.  Mouth/Throat: Oropharynx is clear and moist. No oropharyngeal exudate.  Eyes: Conjunctivae and EOM are normal. Pupils are equal, round, and reactive to light. Right eye exhibits no discharge. Left eye exhibits no discharge. No scleral icterus.  Neck: Normal range of motion. Neck supple. No JVD present. No thyromegaly present.  Cardiovascular: Normal rate, regular rhythm, normal heart sounds and intact distal pulses.  Exam reveals no gallop and no friction rub.   No murmur heard. Pulmonary/Chest: Effort normal and breath sounds normal. No respiratory distress. She has no wheezes. She has no rales.  Abdominal: Soft. Bowel sounds are normal. She exhibits no distension and no mass. There is no tenderness.  Musculoskeletal: Normal range of motion. She exhibits no edema and no tenderness.  Lymphadenopathy:    She has no cervical adenopathy.  Neurological: She is alert. Coordination normal.  Skin: Skin is warm and dry. No rash noted. No erythema.  Psychiatric: She has a normal mood and affect. Her behavior is normal.    ED Course  Procedures (including critical care time) Labs Review Labs Reviewed  POCT I-STAT, CHEM 8 - Abnormal; Notable for the following:    Glucose, Bld 127 (*)    Hemoglobin 15.3 (*)  All other components within normal limits  TSH   Imaging Review No results found.  EKG Interpretation   None       MDM   1. Palpitations    Ms. Heffler has a normal cardiac exam, her EKG appears normal sinus rhythm without any specific findings. She does have the occasional PVC on the cardiac monitor as I examine her in the room but has no chest pain, no shortness of breath, no signs of pulmonary embolism. She  has recently increased the dose of her Synthroid, TSH has been ordered for her to followup with her family physician, will check electrolytes, possibly holiday heart given increased alcohol intake this evening but appears to have resolved spontaneously and is currently in a normal sinus rhythm.  ED ECG REPORT  I personally interpreted this EKG   Date: 10/04/2013   Rate: 98  Rhythm: normal sinus rhythm  QRS Axis: normal  Intervals: normal  ST/T Wave abnormalities: normal  Conduction Disutrbances:none  Narrative Interpretation:   Old EKG Reviewed: Compared with 08/27/2011, no significant changes  BMP normal - pt stable and essentially asymptomatic on d/c.     Johnna Acosta, MD 10/04/13 [phone]

## 2013-10-04 NOTE — ED Notes (Signed)
Pt. reports palpitations " heart racing " onset last night , denies chest pain or SOB .

## 2013-10-04 NOTE — Discharge Instructions (Signed)
Your testing is normal so far - please have your doctor follow up your thyroid testing this week as you may need an adjustment to your thyroid medicines.  Please call your doctor for a followup appointment within 24-48 hours. When you talk to your doctor please let them know that you were seen in the emergency department and have them acquire all of your records so that they can discuss the findings with you and formulate a treatment plan to fully care for your new and ongoing problems.

## 2013-10-04 NOTE — ED Notes (Signed)
Pt ambulating independently w/ steady gait on d/c in no acute distress, A&Ox4.D/c instructions reviewed w/ pt and family - pt and family deny any further questions or concerns at present.  

## 2014-04-30 ENCOUNTER — Other Ambulatory Visit (HOSPITAL_COMMUNITY): Payer: Self-pay | Admitting: Internal Medicine

## 2014-04-30 ENCOUNTER — Ambulatory Visit (HOSPITAL_COMMUNITY)
Admission: RE | Admit: 2014-04-30 | Discharge: 2014-04-30 | Disposition: A | Discharge status: Home / Self Care | Payer: BC Managed Care – PPO | Source: Ambulatory Visit | Attending: Vascular Surgery | Admitting: Vascular Surgery

## 2014-04-30 DIAGNOSIS — R609 Edema, unspecified: Secondary | ICD-10-CM

## 2015-03-19 ENCOUNTER — Encounter: Payer: Self-pay | Admitting: Internal Medicine

## 2015-04-01 ENCOUNTER — Ambulatory Visit (AMBULATORY_SURGERY_CENTER): Payer: Self-pay

## 2015-04-01 VITALS — Ht 66.5 in | Wt 203.8 lb

## 2015-04-01 DIAGNOSIS — Z1211 Encounter for screening for malignant neoplasm of colon: Secondary | ICD-10-CM

## 2015-04-01 MED ORDER — MOVIPREP 100 G PO SOLR: ORAL | Status: DC

## 2015-04-01 NOTE — Progress Notes (Signed)
Per pt, no allergies to soy or egg products.Pt not taking any weight loss meds or using  O2 at home. 

## 2015-04-10 ENCOUNTER — Encounter: Payer: Self-pay | Admitting: Internal Medicine

## 2015-04-10 ENCOUNTER — Ambulatory Visit (AMBULATORY_SURGERY_CENTER): Payer: BLUE CROSS/BLUE SHIELD | Admitting: Internal Medicine

## 2015-04-10 VITALS — BP 125/72 | HR 72 | Temp 98.7°F | Resp 28 | Ht 66.5 in | Wt 203.0 lb

## 2015-04-10 DIAGNOSIS — D123 Benign neoplasm of transverse colon: Secondary | ICD-10-CM

## 2015-04-10 DIAGNOSIS — Z1211 Encounter for screening for malignant neoplasm of colon: Secondary | ICD-10-CM

## 2015-04-10 MED ORDER — SODIUM CHLORIDE 0.9 % IV SOLN: 500.0000 mL | INTRAVENOUS | Status: DC

## 2015-04-10 NOTE — Progress Notes (Signed)
Called to room to assist during endoscopic procedure.  Patient ID and intended procedure confirmed with present staff. Received instructions for my participation in the procedure from the performing physician.  

## 2015-04-10 NOTE — Op Note (Signed)
[facility]  [address] [place], [id number]   COLONOSCOPY PROCEDURE REPORT  PATIENT: [name], [name]  MR#: [id number] BIRTHDATE: 1956-01-10 , 23  yrs. old GENDER: female ENDOSCOPIST: Eustace Quail, MD REFERRED UE:[facility] Virgina Jock, M.D. PROCEDURE DATE:  04/10/2015 PROCEDURE:   Colonoscopy, screening and Colonoscopy with snare polypectomy x 1 First Screening Colonoscopy - Avg.  risk and is 50 yrs.  old or older Yes.  Prior Negative Screening - Now for repeat screening. N/A  History of Adenoma - Now for follow-up colonoscopy & has been > or = to 3 yrs.  N/A  Polyps removed today? Yes ASA CLASS:   Class II INDICATIONS:Screening for colonic neoplasia and Colorectal Neoplasm Risk Assessment for this procedure is average risk. MEDICATIONS: Monitored anesthesia care and Propofol 300 mg IV  DESCRIPTION OF PROCEDURE:   After the risks benefits and alternatives of the procedure were thoroughly explained, informed consent was obtained.  The digital rectal exam revealed no abnormalities of the rectum.   The LB [id number]  endoscope was introduced through the anus and advanced to the cecum, which was identified by both the appendix and ileocecal valve. No adverse events experienced.   The quality of the prep was excellent. (MoviPrep was used)  The instrument was then slowly withdrawn as the colon was fully examined. Estimated blood loss is zero unless otherwise noted in this procedure report.    COLON FINDINGS: A single polyp measuring 2 mm in size was found in the transverse colon.  A polypectomy was performed with a cold snare.  The resection was complete, the polyp tissue was completely retrieved and sent to histology.   There was mild diverticulosis noted(A few scattered diverticula).   The examination was otherwise normal.  Retroflexed views revealed internal hemorrhoids. The time to cecum = 4.1 Withdrawal time = 10.9   The scope was withdrawn and the procedure  completed. COMPLICATIONS: There were no immediate complications.  ENDOSCOPIC IMPRESSION: 1.   Single polyp was found in the transverse colon; polypectomy was performed with a cold snare 2.   Mild diverticulosis was noted 3.   The examination was otherwise normal  RECOMMENDATIONS: 1. Repeat colonoscopy in 5 years if polyp adenomatous; otherwise 10 years  eSigned:  Eustace Quail, MD 04/10/2015 1:53 PM   cc: Shon Baton, MD and The Patient

## 2015-04-10 NOTE — Patient Instructions (Signed)
YOU HAD AN ENDOSCOPIC PROCEDURE TODAY AT [facility]:   Refer to the procedure report that was given to you for any specific questions about what was found during the examination.  If the procedure report does not answer your questions, please call your gastroenterologist to clarify.  If you requested that your care partner not be given the details of your procedure findings, then the procedure report has been included in a sealed envelope for you to review at your convenience later.  YOU SHOULD EXPECT: Some feelings of bloating in the abdomen. Passage of more gas than usual.  Walking can help get rid of the air that was put into your GI tract during the procedure and reduce the bloating. If you had a lower endoscopy (such as a colonoscopy or flexible sigmoidoscopy) you may notice spotting of blood in your stool or on the toilet paper. If you underwent a bowel prep for your procedure, you may not have a normal bowel movement for a few days.  Please Note:  You might notice some irritation and congestion in your nose or some drainage.  This is from the oxygen used during your procedure.  There is no need for concern and it should clear up in a day or so.  SYMPTOMS TO REPORT IMMEDIATELY:   Following lower endoscopy (colonoscopy or flexible sigmoidoscopy):  Excessive amounts of blood in the stool  Significant tenderness or worsening of abdominal pains  Swelling of the abdomen that is new, acute  Fever of 100F or higher    For urgent or emergent issues, a gastroenterologist can be reached at any hour by calling [phone].   DIET: Your first meal following the procedure should be a small meal and then it is ok to progress to your normal diet. Heavy or fried foods are harder to digest and may make you feel nauseous or bloated.  Likewise, meals heavy in dairy and vegetables can increase bloating.  Drink plenty of fluids but you should avoid alcoholic beverages for 24  hours.  ACTIVITY:  You should plan to take it easy for the rest of today and you should NOT DRIVE or use heavy machinery until tomorrow (because of the sedation medicines used during the test).    FOLLOW UP: Our staff will call the number listed on your records the next business day following your procedure to check on you and address any questions or concerns that you may have regarding the information given to you following your procedure. If we do not reach you, we will leave a message.  However, if you are feeling well and you are not experiencing any problems, there is no need to return our call.  We will assume that you have returned to your regular daily activities without incident.  If any biopsies were taken you will be contacted by phone or by letter within the next 1-3 weeks.  Please call us at [phone] if you have not heard about the biopsies in 3 weeks.    SIGNATURES/CONFIDENTIALITY: You and/or your care partner have signed paperwork which will be entered into your electronic medical record.  These signatures attest to the fact that that the information above on your After Visit Summary has been reviewed and is understood.  Full responsibility of the confidentiality of this discharge information lies with you and/or your care-partner  INFORMATION ON POLYPS,DIERTICULOSIS,& HIGH FIBER DIET GIVEN TO YOU TODAY   .

## 2015-04-10 NOTE — Progress Notes (Signed)
Report to PACU, RN, vss, BBS= Clear.  

## 2015-04-11 ENCOUNTER — Telehealth: Payer: Self-pay

## 2015-04-11 NOTE — Telephone Encounter (Signed)
  Follow up Call-  Call back number 04/10/2015  Post procedure Call Back phone  # [phone]  Permission to leave phone message Yes     Patient questions:  Do you have a fever, pain , or abdominal swelling? No. Pain Score  0 *  Have you tolerated food without any problems? Yes.    Have you been able to return to your normal activities? Yes.    Do you have any questions about your discharge instructions: Diet   No. Medications  No. Follow up visit  No.  Do you have questions or concerns about your Care? No.  Actions: * If pain score is 4 or above: No action needed, pain <4.

## 2015-04-15 ENCOUNTER — Encounter: Payer: Self-pay | Admitting: Internal Medicine

## 2015-04-16 ENCOUNTER — Encounter: Payer: Self-pay | Admitting: Internal Medicine

## 2015-09-04 ENCOUNTER — Encounter (INDEPENDENT_AMBULATORY_CARE_PROVIDER_SITE_OTHER): Payer: BLUE CROSS/BLUE SHIELD

## 2015-09-04 DIAGNOSIS — R002 Palpitations: Secondary | ICD-10-CM | POA: Diagnosis not present

## 2015-11-20 DIAGNOSIS — M6740 Ganglion, unspecified site: Secondary | ICD-10-CM | POA: Insufficient documentation

## 2016-01-18 ENCOUNTER — Encounter (HOSPITAL_COMMUNITY): Payer: Self-pay | Admitting: Emergency Medicine

## 2016-01-18 ENCOUNTER — Emergency Department (HOSPITAL_COMMUNITY)
Admission: EM | Admit: 2016-01-18 | Discharge: 2016-01-18 | Disposition: A | Discharge status: Home / Self Care | Payer: BLUE CROSS/BLUE SHIELD | Attending: Emergency Medicine | Admitting: Emergency Medicine

## 2016-01-18 ENCOUNTER — Emergency Department (HOSPITAL_COMMUNITY): Payer: BLUE CROSS/BLUE SHIELD

## 2016-01-18 DIAGNOSIS — R008 Other abnormalities of heart beat: Secondary | ICD-10-CM | POA: Diagnosis present

## 2016-01-18 DIAGNOSIS — K219 Gastro-esophageal reflux disease without esophagitis: Secondary | ICD-10-CM | POA: Insufficient documentation

## 2016-01-18 DIAGNOSIS — Z79899 Other long term (current) drug therapy: Secondary | ICD-10-CM | POA: Diagnosis not present

## 2016-01-18 DIAGNOSIS — E039 Hypothyroidism, unspecified: Secondary | ICD-10-CM | POA: Diagnosis not present

## 2016-01-18 DIAGNOSIS — R002 Palpitations: Secondary | ICD-10-CM | POA: Insufficient documentation

## 2016-01-18 LAB — BASIC METABOLIC PANEL
ANION GAP: 10 (ref 5–15)
BUN: 13 mg/dL (ref 6–20)
CALCIUM: 9.4 mg/dL (ref 8.9–10.3)
CO2: 24 mmol/L (ref 22–32)
CREATININE: 0.81 mg/dL (ref 0.44–1.00)
Chloride: 107 mmol/L (ref 101–111)
Glucose, Bld: 118 mg/dL — ABNORMAL HIGH (ref 65–99)
Potassium: 4.1 mmol/L (ref 3.5–5.1)
Sodium: 141 mmol/L (ref 135–145)

## 2016-01-18 LAB — I-STAT TROPONIN, ED: TROPONIN I, POC: 0 ng/mL (ref 0.00–0.08)

## 2016-01-18 LAB — CBC
HCT: 44.1 % (ref 36.0–46.0)
HEMOGLOBIN: 14.6 g/dL (ref 12.0–15.0)
MCH: 31.5 pg (ref 26.0–34.0)
MCHC: 33.1 g/dL (ref 30.0–36.0)
MCV: 95 fL (ref 78.0–100.0)
PLATELETS: 410 10*3/uL — AB (ref 150–400)
RBC: 4.64 MIL/uL (ref 3.87–5.11)
RDW: 13.1 % (ref 11.5–15.5)
WBC: 9.8 10*3/uL (ref 4.0–10.5)

## 2016-01-18 NOTE — ED Provider Notes (Signed)
CSN: [id number]     Arrival date & time 01/18/16  0601 History   First MD Initiated Contact with Patient 01/18/16 [phone]     Chief Complaint  Patient presents with  . Irregular Heart Beat     (Consider location/radiation/quality/duration/timing/severity/associated sxs/prior Treatment) The history is provided by the patient and medical records.    60 year old female with history of hypothyroidism and GERD, presenting to the ED for a regular heartbeat this morning. Patient states she woke up at 0450 and felt a "flopping" sensation in her chest. She reports this lasted approximately 7-8 minutes before resolving completely. She states during this time she did feel somewhat nauseated during this but denies any chest pain, SOB, nausea, or vomiting.  She has no history of arrhythmia in the past. She has no known cardiac history. Patient is not a smoker. She was admitted to the hospital approximately 10 years ago due to some chest discomfort had a negative Myoview and 2-D echo at that time. She states her PCP also had her wear a Holter monitor a few months ago which did not reveal any significant arrhythmia or PVCs. She does admit to increased stress at work and lack of sleep recently. She denies any excessive caffeine intake. She has recently changed her diet in effort to lose weight, this is been going well. She also recently had her thyroid checked by her PCP, levels were normal and did not require any medication adjustment. Patient is hypertensive, she reports pressure has been elevated in the past, PCP is thinking of starting her on hypertensive medication but is not currently on anything at this time.  Of note, patient did report some pressure across her neck and shoulders, she has recently been moving heavy boxes at home. States she feels this is muscular skeletal in nature.  Past Medical History  Diagnosis Date  . Hypothyroid   . GERD (gastroesophageal reflux disease)    Past Surgical History   Procedure Laterality Date  . Tonsillectomy  1962  . Upper gastrointestinal endoscopy    . Wrist fracture surgery  2002    right wrist   Family History  Problem Relation Age of Onset  . Thyroid disease Mother   . Hypertension Mother   . Thyroid disease Sister   . Colon cancer Neg Hx    Social History  Substance Use Topics  . Smoking status: Never Smoker   . Smokeless tobacco: Never Used  . Alcohol Use: No   OB History    No data available     Review of Systems  Cardiovascular: Positive for palpitations.  All other systems reviewed and are negative.     Allergies  Macrodantin  Home Medications   Prior to Admission medications   Medication Sig Start Date End Date Taking? Authorizing Provider  calcium carbonate (TUMS - DOSED IN MG ELEMENTAL CALCIUM) 500 MG chewable tablet Chew 1 tablet by mouth. 6 pills daily    Historical Provider, MD  SYNTHROID 125 MCG tablet  03/25/15   Historical Provider, MD   BP 180/114 mmHg  Pulse 72  Temp(Src) 98 F (36.7 C) (Oral)  Resp 16  Ht 5\' 6"  (1.676 m)  Wt 94.972 kg  BMI 33.81 kg/m2  SpO2 99%   Physical Exam  Constitutional: She is oriented to person, place, and time. She appears well-developed and well-nourished. No distress.  HENT:  Head: Normocephalic and atraumatic.  Mouth/Throat: Oropharynx is clear and moist.  Eyes: Conjunctivae and EOM are normal. Pupils are equal, round,  and reactive to light.  Neck: Normal range of motion. Neck supple.  Cardiovascular: Normal rate, regular rhythm and normal heart sounds.   Pulmonary/Chest: Effort normal and breath sounds normal. No respiratory distress. She has no wheezes.  Abdominal: Soft. Bowel sounds are normal. There is no tenderness. There is no guarding.  Musculoskeletal: Normal range of motion. She exhibits no edema.  2 small tick bites noted to left leg; no signs of superimposed infection or cellulitis, no drainage, no remaining ticks present, no bodily rash  Neurological: She  is alert and oriented to person, place, and time.  Skin: Skin is warm and dry. No rash noted. She is not diaphoretic.  Psychiatric: She has a normal mood and affect.  Nursing note and vitals reviewed.   ED Course  Procedures (including critical care time) Labs Review Labs Reviewed  BASIC METABOLIC PANEL - Abnormal; Notable for the following:    Glucose, Bld 118 (*)    All other components within normal limits  CBC - Abnormal; Notable for the following:    Platelets 410 (*)    All other components within normal limits  I-STAT TROPOININ, ED    Imaging Review Dg Chest 2 View  01/18/2016  CLINICAL DATA:  Palpitations and chest pressure EXAM: CHEST  2 VIEW COMPARISON:  08/27/2011 FINDINGS: Normal heart size and mediastinal contours. No acute infiltrate or edema. No effusion or pneumothorax. No acute osseous findings. IMPRESSION: Stable.  No evidence of acute cardiopulmonary disease. Electronically Signed   By: Monte Fantasia M.D.   On: 01/18/2016 06:41   I have personally reviewed and evaluated these images and lab results as part of my medical decision-making.   EKG Interpretation   Date/Time:  Sunday January 18 2016 06:43:41 EDT Ventricular Rate:  89 PR Interval:  138 QRS Duration: 76 QT Interval:  348 QTC Calculation: 423 R Axis:   77 Text Interpretation:  Normal sinus rhythm Possible Left atrial enlargement  Borderline ECG Confirmed by Christy Gentles  MD, Elenore Rota ([id number]) on 01/18/2016  6:52:04 AM      MDM   Final diagnoses:  Palpitations   60 year old female here with questionable arrhythmia this morning. Reports this is completely resolved at this time and she feels back to her baseline. Her EKG is normal sinus rhythm without acute ischemic changes. Her lab work is reassuring. Chest x-ray is clear. Patient with questionable arrhythmia this morning, appears to have resolved spontaneously. Patient remains asymptomatic here in the emergency department. He does have history of  hypothyroidism, however recently had TSH checked by PCP which was normal. She is also worn a Holter monitor a few months ago that was also normal.   Patient may very well be having episodes of PAF, however no documented episodes here in the ED.  Her BP has improved in the ED without intervention.  Have offered to send repeat TSH, however patient states she would prefer to follow-up with her doctor.  Will make appt for next week.  She may need to follow-up with cardiology again if episodes persist.  Discussed plan with patient, he/she acknowledged understanding and agreed with plan of care.  Return precautions given for new or worsening symptoms.  Larene Pickett, PA-C 01/18/16 1017  Merrily Pew, MD 01/18/16 [phone]

## 2016-01-18 NOTE — Discharge Instructions (Signed)
Your cardiac work-up today including EKG, labs, and CXR were all normal. Recommend that you follow-up with your primary care doctor about this.  He may wish to send you back to cardiology if you have recurrent episodes. Return here for any new/worsening symptoms-- recurrent episodes, chest pain, shortness of breath, dizziness, weakness.

## 2016-01-18 NOTE — ED Notes (Signed)
Reports waking up 10 minutes till 5 with heart "flopping" around in chest.  Also reports having nausea but no vomiting.  Reports that heart feels normal now.  C/o pressure across back of neck that started this morning.

## 2016-04-20 ENCOUNTER — Ambulatory Visit (INDEPENDENT_AMBULATORY_CARE_PROVIDER_SITE_OTHER): Payer: BLUE CROSS/BLUE SHIELD

## 2016-04-20 ENCOUNTER — Ambulatory Visit (INDEPENDENT_AMBULATORY_CARE_PROVIDER_SITE_OTHER): Payer: BLUE CROSS/BLUE SHIELD | Admitting: Podiatry

## 2016-04-20 ENCOUNTER — Encounter: Payer: Self-pay | Admitting: Podiatry

## 2016-04-20 VITALS — BP 123/70 | HR 80 | Resp 12

## 2016-04-20 DIAGNOSIS — S93401A Sprain of unspecified ligament of right ankle, initial encounter: Secondary | ICD-10-CM

## 2016-04-20 DIAGNOSIS — M79671 Pain in right foot: Secondary | ICD-10-CM | POA: Diagnosis not present

## 2016-04-20 NOTE — Progress Notes (Signed)
   Subjective:    Patient ID: [name], female    DOB: April 18, 1956, 60 y.o.   MRN: [id number]  HPI: She presents today with a chief complaint of an ankle and right foot injury is been painful now for approximately 2 weeks she states the foot seems to be worse when walking she tried ice and compression no relief.    Review of Systems  Musculoskeletal: Positive for gait problem.       Objective:   Physical Exam: Vital signs are stable she is alert and oriented 3. Pulses are palpable. Neurologic sensorium is intact deep tendon reflexes are intact muscle strength is normal. Orthopedic evaluation demonstrates all joints of the ankle for range of motion. There is no ecchymosis no edema no erythema she has tenderness on palpation of the fourth and fifth met cuboid articulation left foot. Ankle joint is mildly tender on palpation. Radiographs taken today do not demonstrate any type of osseus abnormalities. Soft tissues rolling lateral ankle. Cutaneous evaluation no open lesions or wounds.      Assessment & Plan:  Assessment: Sprained dorsal lateral foot right and sprained right ankle.  Plan: Discussed etiology pathology conservative versus surgical therapies. Placed her in a Tri-Lock brace and will follow up with her in 3-4 weeks and consider MRI if she has not improved considerably by that point.

## 2016-05-20 ENCOUNTER — Ambulatory Visit (INDEPENDENT_AMBULATORY_CARE_PROVIDER_SITE_OTHER): Payer: BLUE CROSS/BLUE SHIELD | Admitting: Podiatry

## 2016-05-20 DIAGNOSIS — M722 Plantar fascial fibromatosis: Secondary | ICD-10-CM | POA: Diagnosis not present

## 2016-05-20 DIAGNOSIS — M76821 Posterior tibial tendinitis, right leg: Secondary | ICD-10-CM

## 2016-05-20 DIAGNOSIS — S93401D Sprain of unspecified ligament of right ankle, subsequent encounter: Secondary | ICD-10-CM

## 2016-05-20 MED ORDER — METHYLPREDNISOLONE 4 MG PO TBPK: ORAL_TABLET | ORAL | 0 refills | Status: DC

## 2016-05-20 NOTE — Patient Instructions (Signed)

## 2016-05-20 NOTE — Progress Notes (Signed)
She presented for follow-up of an ankle sprain. She states that she's doing much better with that however she started to have pain along the medial aspect of the foot and her right heel.  Objective: Vital signs are stable she is alert and oriented 3. Pulses are palpable. She has very little in the way of tenderness or pain on palpation of the anterolateral ankle however she does have pain on palpation of the insertional or the distal aspect of the posterior tibial tendon and of the plantar fascia.  Assessment: Plantar fasciitis compensatory posterior tibial tendinitis right foot.  Plan: Start her on a Medrol Dosepak to be followed up in 3 weeks. Also injected her right heel and plantar plantar fascia brace we discussed appropriate shoes. Follow up with her in the near future consider MRI if she is not improved next visit.

## 2016-06-10 ENCOUNTER — Ambulatory Visit: Payer: BLUE CROSS/BLUE SHIELD | Admitting: Podiatry

## 2018-01-17 ENCOUNTER — Other Ambulatory Visit: Payer: Self-pay | Admitting: Internal Medicine

## 2018-01-17 DIAGNOSIS — E785 Hyperlipidemia, unspecified: Secondary | ICD-10-CM

## 2018-01-20 ENCOUNTER — Ambulatory Visit
Admission: RE | Admit: 2018-01-20 | Discharge: 2018-01-20 | Disposition: A | Discharge status: Home / Self Care | Payer: No Typology Code available for payment source | Source: Ambulatory Visit | Attending: Internal Medicine | Admitting: Internal Medicine

## 2018-01-20 DIAGNOSIS — E785 Hyperlipidemia, unspecified: Secondary | ICD-10-CM

## 2018-05-01 ENCOUNTER — Emergency Department (HOSPITAL_COMMUNITY): Payer: BLUE CROSS/BLUE SHIELD

## 2018-05-01 ENCOUNTER — Emergency Department (HOSPITAL_COMMUNITY)
Admission: EM | Admit: 2018-05-01 | Discharge: 2018-05-02 | Disposition: A | Discharge status: Home / Self Care | Payer: BLUE CROSS/BLUE SHIELD | Attending: Emergency Medicine | Admitting: Emergency Medicine

## 2018-05-01 ENCOUNTER — Other Ambulatory Visit: Payer: Self-pay

## 2018-05-01 ENCOUNTER — Encounter (HOSPITAL_COMMUNITY): Payer: Self-pay | Admitting: Emergency Medicine

## 2018-05-01 DIAGNOSIS — D329 Benign neoplasm of meninges, unspecified: Secondary | ICD-10-CM

## 2018-05-01 DIAGNOSIS — H538 Other visual disturbances: Secondary | ICD-10-CM | POA: Insufficient documentation

## 2018-05-01 DIAGNOSIS — D320 Benign neoplasm of cerebral meninges: Secondary | ICD-10-CM | POA: Diagnosis not present

## 2018-05-01 DIAGNOSIS — Z79899 Other long term (current) drug therapy: Secondary | ICD-10-CM | POA: Diagnosis not present

## 2018-05-01 DIAGNOSIS — E039 Hypothyroidism, unspecified: Secondary | ICD-10-CM | POA: Insufficient documentation

## 2018-05-01 DIAGNOSIS — R42 Dizziness and giddiness: Secondary | ICD-10-CM | POA: Diagnosis present

## 2018-05-01 LAB — I-STAT CHEM 8, ED
BUN: 20 mg/dL (ref 6–20)
CALCIUM ION: 1.2 mmol/L (ref 1.15–1.40)
Chloride: 104 mmol/L (ref 98–111)
Creatinine, Ser: 0.8 mg/dL (ref 0.44–1.00)
Glucose, Bld: 115 mg/dL — ABNORMAL HIGH (ref 70–99)
HCT: 43 % (ref 36.0–46.0)
Hemoglobin: 14.6 g/dL (ref 12.0–15.0)
Potassium: 4 mmol/L (ref 3.5–5.1)
SODIUM: 140 mmol/L (ref 135–145)
TCO2: 26 mmol/L (ref 22–32)

## 2018-05-01 LAB — DIFFERENTIAL
Abs Immature Granulocytes: 0.3 10*3/uL — ABNORMAL HIGH (ref 0.0–0.1)
BASOS ABS: 0.2 10*3/uL — AB (ref 0.0–0.1)
Basophils Relative: 1 %
Eosinophils Absolute: 0.7 10*3/uL (ref 0.0–0.7)
Eosinophils Relative: 4 %
IMMATURE GRANULOCYTES: 2 %
LYMPHS PCT: 33 %
Lymphs Abs: 5.1 10*3/uL — ABNORMAL HIGH (ref 0.7–4.0)
Monocytes Absolute: 1.3 10*3/uL — ABNORMAL HIGH (ref 0.1–1.0)
Monocytes Relative: 8 %
Neutro Abs: 8.1 10*3/uL — ABNORMAL HIGH (ref 1.7–7.7)
Neutrophils Relative %: 52 %

## 2018-05-01 LAB — COMPREHENSIVE METABOLIC PANEL
ALK PHOS: 90 U/L (ref 38–126)
ALT: 28 U/L (ref 0–44)
ANION GAP: 9 (ref 5–15)
AST: 31 U/L (ref 15–41)
Albumin: 3.8 g/dL (ref 3.5–5.0)
BUN: 17 mg/dL (ref 6–20)
CALCIUM: 9.3 mg/dL (ref 8.9–10.3)
CO2: 27 mmol/L (ref 22–32)
Chloride: 104 mmol/L (ref 98–111)
Creatinine, Ser: 0.79 mg/dL (ref 0.44–1.00)
GFR calc Af Amer: >60 mL/min (ref >60)
GFR calc non Af Amer: >60 mL/min (ref >60)
Glucose, Bld: 118 mg/dL — ABNORMAL HIGH (ref 70–99)
Potassium: 4.1 mmol/L (ref 3.5–5.1)
SODIUM: 140 mmol/L (ref 135–145)
Total Bilirubin: 0.8 mg/dL (ref 0.3–1.2)
Total Protein: 7.3 g/dL (ref 6.5–8.1)

## 2018-05-01 LAB — CBC
HEMATOCRIT: 41.8 % (ref 36.0–46.0)
Hemoglobin: 13.9 g/dL (ref 12.0–15.0)
MCH: 31.3 pg (ref 26.0–34.0)
MCHC: 33.3 g/dL (ref 30.0–36.0)
MCV: 94.1 fL (ref 78.0–100.0)
Platelets: 458 10*3/uL — ABNORMAL HIGH (ref 150–400)
RBC: 4.44 MIL/uL (ref 3.87–5.11)
RDW: 12.7 % (ref 11.5–15.5)
WBC: 15.6 10*3/uL — AB (ref 4.0–10.5)

## 2018-05-01 LAB — PROTIME-INR
INR: 1.02
Prothrombin Time: 13.3 seconds (ref 11.4–15.2)

## 2018-05-01 LAB — I-STAT TROPONIN, ED: Troponin i, poc: 0 ng/mL (ref 0.00–0.08)

## 2018-05-01 LAB — APTT: APTT: 25 s (ref 24–36)

## 2018-05-01 NOTE — ED Triage Notes (Signed)
Pt reports she was having some dizziness and vision problems that started tonight at 2030 and sx have since mostly resolved. Pt reports nausea. No neuro sx. Denies pain.

## 2018-05-02 ENCOUNTER — Emergency Department (HOSPITAL_COMMUNITY): Payer: BLUE CROSS/BLUE SHIELD

## 2018-05-02 NOTE — ED Provider Notes (Signed)
[facility] EMERGENCY DEPARTMENT Provider Note   CSN: [id number] Arrival date & time: 05/01/18  2134     History   Chief Complaint Chief Complaint  Patient presents with  . Dizziness    HPI [name] is a 62 y.o. female.  The history is provided by the patient and medical records.    62 year old female with history of GERD and hypothyroidism, presenting to the ED after episode of dizziness.  States she was sitting at home on the couch when her vision all of a sudden felt like a "kaleidoscope".  States during this time she felt a little bit "woozy" but denies any room spinning sensations.  States this lasted for about 40 minutes but then resolved spontaneously and has been normal since.  States generally no issues with her vision, she does wear glasses but no corrective lenses.  She denies any significant headache, photophobia, phonophobia, aura, confusion, numbness, or weakness of her extremities.  No difficulty walking.  States she is never had issues like that in the past which is what prompted her to come in today.  She has not had any new medications.  No drugs or alcohol.  Past Medical History:  Diagnosis Date  . GERD (gastroesophageal reflux disease)   . Hypothyroid     Patient Active Problem List   Diagnosis Date Noted  . Mucoid cyst of joint 11/20/2015  . Palpitations 09/04/2015  . HYPOTHYROIDISM 12/21/2007  . HYPERLIPIDEMIA 12/21/2007  . RHINOSINUSITIS, ALLERGIC 12/21/2007  . GERD 12/21/2007    Past Surgical History:  Procedure Laterality Date  . TONSILLECTOMY  1962  . UPPER GASTROINTESTINAL ENDOSCOPY    . WRIST FRACTURE SURGERY  2002   right wrist     OB History   None      Home Medications    Prior to Admission medications   Medication Sig Start Date End Date Taking? Authorizing Provider  calcium carbonate (TUMS - DOSED IN MG ELEMENTAL CALCIUM) 500 MG chewable tablet Chew 1 tablet by mouth. 6 pills daily    [provider]  D3-50 50000 units capsule TAKE ONE CAPSULE BY MOUTH EVERY WEEK FOR 8 WEEKS 03/17/16   [provider]  losartan-hydrochlorothiazide [facility]) 50-12.5 MG tablet  03/15/16   [provider]  methylPREDNISolone (MEDROL DOSEPAK) 4 MG TBPK tablet 6 day dose pack - take as directed 05/20/16   Garrel Ridgel, DPM  SYNTHROID 125 MCG tablet  03/25/15   [provider]    Family History Family History  Problem Relation Age of Onset  . Thyroid disease Mother   . Hypertension Mother   . Thyroid disease Sister   . Colon cancer Neg Hx     Social History Social History   Tobacco Use  . Smoking status: Never Smoker  . Smokeless tobacco: Never Used  Substance Use Topics  . Alcohol use: No    Alcohol/week: 0.0 standard drinks  . Drug use: No     Allergies   Macrodantin [nitrofurantoin]   Review of Systems Review of Systems  Eyes: Positive for visual disturbance.  All other systems reviewed and are negative.    Physical Exam Updated Vital Signs BP 132/73 (BP Location: Right Arm)   Pulse 68   Temp 98 F (36.7 C) (Oral)   Resp 16   Ht 5\' 6"  (1.676 m)   Wt 95.3 kg   SpO2 100%   BMI 33.89 kg/m   Physical Exam  Constitutional: She is oriented to  person, place, and time. She appears well-developed and well-nourished.  HENT:  Head: Normocephalic and atraumatic.  Mouth/Throat: Oropharynx is clear and moist.  Eyes: Pupils are equal, round, and reactive to light. Conjunctivae and EOM are normal.  No lid edema or erythema, no conjunctival injection, EOMs fully intact, no nystagmus  Neck: Normal range of motion.  Cardiovascular: Normal rate, regular rhythm and normal heart sounds.  Pulmonary/Chest: Effort normal and breath sounds normal.  Abdominal: Soft. Bowel sounds are normal. There is no tenderness. There is no rebound.  Musculoskeletal: Normal range of motion.  Neurological: She is alert and oriented to person, place, and time.  AAOx3,  answering questions and following commands appropriately; equal strength UE and LE bilaterally; CN grossly intact; moves all extremities appropriately without ataxia; no focal neuro deficits or facial asymmetry appreciated  Skin: Skin is warm and dry.  Psychiatric: She has a normal mood and affect.  Nursing note and vitals reviewed.    ED Treatments / Results  Labs (all labs ordered are listed, but only abnormal results are displayed) Labs Reviewed  CBC - Abnormal; Notable for the following components:      Result Value   WBC 15.6 (*)    Platelets 458 (*)    All other components within normal limits  DIFFERENTIAL - Abnormal; Notable for the following components:   Neutro Abs 8.1 (*)    Lymphs Abs 5.1 (*)    Monocytes Absolute 1.3 (*)    Basophils Absolute 0.2 (*)    Abs Immature Granulocytes 0.3 (*)    All other components within normal limits  COMPREHENSIVE METABOLIC PANEL - Abnormal; Notable for the following components:   Glucose, Bld 118 (*)    All other components within normal limits  I-STAT CHEM 8, ED - Abnormal; Notable for the following components:   Glucose, Bld 115 (*)    All other components within normal limits  PROTIME-INR  APTT  I-STAT TROPONIN, ED  CBG MONITORING, ED    EKG EKG Interpretation  Date/Time:  Monday May 01 2018 22:30:43 EDT Ventricular Rate:  78 PR Interval:  126 QRS Duration: 80 QT Interval:  360 QTC Calculation: 410 R Axis:   71 Text Interpretation:  Normal sinus rhythm Confirmed by Randal Buba, April ([id number]) on 05/02/2018 12:39:26 AM   Radiology Ct Head Wo Contrast  Result Date: 05/01/2018 CLINICAL DATA:  Dizziness and wavy vision since 8 p.m. EXAM: CT HEAD WITHOUT CONTRAST TECHNIQUE: Contiguous axial images were obtained from the base of the skull through the vertex without intravenous contrast. COMPARISON:  None. FINDINGS: Brain: There is an extra-axial mass lesion at the vertex to the left of the falx measuring 1.6 cm diameter and  containing some calcification. This would most likely represent a meningioma. Could consider MRI for confirmation if clinically indicated. There is no significant mass effect. No acute intracranial hemorrhage. Ventricles and sulci are symmetrical. Gray-white matter junctions are distinct. Basal cisterns are not effaced. Vascular: No hyperdense vessel or unexpected calcification. Skull: Normal. Negative for fracture or focal lesion. Sinuses/Orbits: Mucosal thickening in the paranasal sinuses. No acute air-fluid levels. Mastoid air cells are clear. Other: None. IMPRESSION: 1.6 cm diameter extra-axial mass at the vertex to the left of the falx. This would most likely represent a meningioma. Could consider MRI for further evaluation. No acute intracranial hemorrhage or mass effect. Electronically Signed   By: Lucienne Capers M.D.   On: 05/01/2018 23:36   Mr Brain Wo Contrast  Result Date: 05/02/2018 CLINICAL DATA:  62 y/o F; dizziness and vision problems. Possible meningioma on CT. EXAM: MRI HEAD WITHOUT CONTRAST TECHNIQUE: Multiplanar, multiecho pulse sequences of the brain and surrounding structures were obtained without intravenous contrast. COMPARISON:  05/01/2018 CT head. FINDINGS: Brain: Round extra-axial mass along the left aspect of falx anterior to central sulcus measuring 2.2 x 1.7 x 1.6 cm (AP x ML x CC series 9, image 13 and series 10, image 25). The mass demonstrates intermediate T1 and mildly hyperintense T2 signal with intermediate diffusion. The mass abuts the superior sagittal sinus and insert mild mass effect on the sinus, however, the sinus flow void is maintained indicating sinus patency (series 15, image 11). Few nonspecific T2 FLAIR hyperintensities in subcortical and periventricular white matter are compatible with mild chronic microvascular ischemic changes for age. Mild volume loss of the brain. No reduced diffusion to suggest acute or early subacute infarction. No abnormal susceptibility  hypointensity to indicate intracranial hemorrhage. No extra-axial collection, hydrocephalus, or herniation. Vascular: Normal flow voids. Skull and upper cervical spine: Normal marrow signal. Sinuses/Orbits: Negative. Other: None. IMPRESSION: 1. Round extra-axial mass along left aspect of falx anterior to central sulcus measuring up to 2.2 cm. Findings very likely represent meningioma. No brain parenchymal, skull, or superior sagittal sinus invasion. Follow-up is recommended to ensure stability. 2. No acute abnormality of the brain. 3. Mild chronic microvascular ischemic changes and volume loss of the brain. Electronically Signed   By: Kristine Garbe M.D.   On: 05/02/2018 02:20    Procedures Procedures (including critical care time)  Medications Ordered in ED Medications - No data to display   Initial Impression / Assessment and Plan / ED Course  I have reviewed the triage vital signs and the nursing notes.  Pertinent labs & imaging results that were available during my care of the patient were reviewed by me and considered in my medical decision making (see chart for details).  62 year old female presenting to the ED with transient dizziness.  She describes as more so as a "kaleidoscope" vision for about 40 minutes, resolved spontaneously without recurrence.  Here she is awake, alert, appropriately oriented.  She has no focal neurologic deficits.  She is ambulatory with steady gait.  Labs all reassuring.  Screening head CT with findings of possible meningioma.  MRI with mass of 2.2 cm, confirming that this is likely a meningioma.  No other acute findings.  She does not have any mass-effect or midline shift.  Patient remained stable here without recurrence of neurologic symptoms.   At this point, I feel she is stable to follow-up as an outpatient.  Will refer to neurosurgery.  She understands to return here immediately for any new/acute changes or recurrence of her symptoms.  Final Clinical  Impressions(s) / ED Diagnoses   Final diagnoses:  Meningioma [facility])    ED Discharge Orders    None       Larene Pickett, PA-C 05/02/18 0303    Palumbo, April, MD 05/02/18 [id number]

## 2018-05-02 NOTE — Discharge Instructions (Signed)
Follow-up with Dr. Arnoldo Morale-- call office to schedule your appt. Return here for any new/acute changes-- new numbness/weakness, confusion, dizziness, blurred vision, etc.

## 2018-09-19 ENCOUNTER — Other Ambulatory Visit: Payer: Self-pay | Admitting: Neurosurgery

## 2018-09-19 ENCOUNTER — Other Ambulatory Visit (HOSPITAL_COMMUNITY): Payer: Self-pay | Admitting: Neurosurgery

## 2018-09-19 DIAGNOSIS — D329 Benign neoplasm of meninges, unspecified: Secondary | ICD-10-CM

## 2018-10-03 ENCOUNTER — Encounter: Payer: Self-pay | Admitting: Podiatry

## 2018-10-03 ENCOUNTER — Ambulatory Visit (INDEPENDENT_AMBULATORY_CARE_PROVIDER_SITE_OTHER): Payer: No Typology Code available for payment source | Admitting: Podiatry

## 2018-10-03 ENCOUNTER — Ambulatory Visit (INDEPENDENT_AMBULATORY_CARE_PROVIDER_SITE_OTHER): Payer: No Typology Code available for payment source

## 2018-10-03 ENCOUNTER — Other Ambulatory Visit: Payer: Self-pay | Admitting: Podiatry

## 2018-10-03 DIAGNOSIS — M109 Gout, unspecified: Secondary | ICD-10-CM

## 2018-10-03 DIAGNOSIS — S9032XA Contusion of left foot, initial encounter: Secondary | ICD-10-CM

## 2018-10-03 MED ORDER — METHYLPREDNISOLONE 4 MG PO TBPK: ORAL_TABLET | ORAL | 0 refills | Status: DC

## 2018-10-03 NOTE — Progress Notes (Signed)
She presents today chief complaint of painful fourth toe on the left foot.  States that she noticed a developing Sunday about 2 days ago.  She denies any trauma to the toe.  Denies a history of gout.  Denies fever chills nausea vomiting muscle aches pains denies any change in her past medical history medications allergies surgery social history.  No recent infections.  Objective: Vital signs are stable she is alert and oriented x3.  She has good range of motion at the metatarsophalangeal joint though it is stiff and very painful the fourth toe is swollen and at the level of the PIPJ is very erythematous warm to the touch and almost purple.  There is some red erythema extending proximal to the metatarsal phalangeal joint.  There are no open lesions or wounds surrounding the joint on the foot.  Radiographs taken today do not demonstrate any acute abnormality of the fourth toe or metatarsal.  Assessment: Cannot rule out a seropositive arthropathy at this point.  Capsulitis.  Plan: Discussed etiology pathology and surgical therapies at this point start her on a Medrol Dosepak placed her in a Darco shoe and requested an arthritic panel to be performed.  I will follow-up with her once the arthritic panel comes back.

## 2018-10-05 ENCOUNTER — Telehealth: Payer: Self-pay | Admitting: *Deleted

## 2018-10-05 LAB — CBC WITH DIFFERENTIAL/PLATELET
Absolute Monocytes: 712 cells/uL (ref 200–950)
BASOS PCT: 1.7 %
Basophils Absolute: 151 cells/uL (ref 0–200)
EOS ABS: 294 {cells}/uL (ref 15–500)
EOS PCT: 3.3 %
HCT: 42.6 % (ref 35.0–45.0)
HEMOGLOBIN: 14.6 g/dL (ref 11.7–15.5)
Lymphs Abs: 2875 cells/uL (ref 850–3900)
MCH: 31.3 pg (ref 27.0–33.0)
MCHC: 34.3 g/dL (ref 32.0–36.0)
MCV: 91.2 fL (ref 80.0–100.0)
MONOS PCT: 8 %
MPV: 9.4 fL (ref 7.5–12.5)
NEUTROS ABS: 4868 {cells}/uL (ref 1500–7800)
Neutrophils Relative %: 54.7 %
Platelets: 453 10*3/uL — ABNORMAL HIGH (ref 140–400)
RBC: 4.67 10*6/uL (ref 3.80–5.10)
RDW: 12.7 % (ref 11.0–15.0)
Total Lymphocyte: 32.3 %
WBC: 8.9 10*3/uL (ref 3.8–10.8)

## 2018-10-05 LAB — CYCLIC CITRUL PEPTIDE ANTIBODY, IGG

## 2018-10-05 LAB — URIC ACID: URIC ACID, SERUM: 8.4 mg/dL — AB (ref 2.5–7.0)

## 2018-10-05 LAB — SEDIMENTATION RATE: Sed Rate: 28 mm/h (ref 0–30)

## 2018-10-05 LAB — RHEUMATOID FACTOR: Rhuematoid fact SerPl-aCnc: <14 IU/mL (ref <14)

## 2018-10-05 LAB — C-REACTIVE PROTEIN: CRP: 20.2 mg/L — ABNORMAL HIGH (ref <8.0)

## 2018-10-05 LAB — HLA-B27 ANTIGEN: HLA-B27 Antigen: NEGATIVE

## 2018-10-05 LAB — ANA: Anti Nuclear Antibody(ANA): NEGATIVE

## 2018-10-05 NOTE — Telephone Encounter (Signed)
Pt called and I informed of Dr. Stephenie Acres review of labs. Pt states the medrol dose pk has already begun to help. I told pt is her pain returned in the next week make an appt for possible injection.

## 2018-10-05 NOTE — Telephone Encounter (Signed)
Left message on mobile to call for results.

## 2018-10-05 NOTE — Telephone Encounter (Signed)
Left message with female answering home phone that I had left message on pt's mobile to call for results and he states that would be fine.

## 2018-10-05 NOTE — Telephone Encounter (Signed)
-----   Message from Garrel Ridgel, [place] sent at 10/04/2018  4:13 PM EST ----- Let her know that it was gout and if the medrol doesn't help by next week we will put a shot in it.

## 2018-10-17 ENCOUNTER — Ambulatory Visit: Payer: No Typology Code available for payment source | Admitting: Podiatry

## 2018-11-07 ENCOUNTER — Other Ambulatory Visit: Payer: Self-pay | Admitting: Internal Medicine

## 2018-11-07 DIAGNOSIS — Z1231 Encounter for screening mammogram for malignant neoplasm of breast: Secondary | ICD-10-CM

## 2018-11-08 ENCOUNTER — Ambulatory Visit
Admission: RE | Admit: 2018-11-08 | Discharge: 2018-11-08 | Disposition: A | Discharge status: Home / Self Care | Payer: PRIVATE HEALTH INSURANCE | Source: Ambulatory Visit | Attending: Internal Medicine | Admitting: Internal Medicine

## 2018-11-08 DIAGNOSIS — Z1231 Encounter for screening mammogram for malignant neoplasm of breast: Secondary | ICD-10-CM

## 2018-11-13 ENCOUNTER — Ambulatory Visit (HOSPITAL_COMMUNITY)
Admission: RE | Admit: 2018-11-13 | Discharge: 2018-11-13 | Disposition: A | Discharge status: Home / Self Care | Payer: PRIVATE HEALTH INSURANCE | Source: Ambulatory Visit | Attending: Neurosurgery | Admitting: Neurosurgery

## 2018-11-13 DIAGNOSIS — D329 Benign neoplasm of meninges, unspecified: Secondary | ICD-10-CM | POA: Diagnosis not present

## 2019-01-26 ENCOUNTER — Other Ambulatory Visit: Payer: Self-pay | Admitting: Neurosurgery

## 2019-01-26 DIAGNOSIS — M542 Cervicalgia: Secondary | ICD-10-CM

## 2019-01-29 ENCOUNTER — Other Ambulatory Visit: Payer: Self-pay

## 2019-01-29 ENCOUNTER — Ambulatory Visit
Admission: RE | Admit: 2019-01-29 | Discharge: 2019-01-29 | Disposition: A | Discharge status: Home / Self Care | Payer: PRIVATE HEALTH INSURANCE | Source: Ambulatory Visit | Attending: Neurosurgery | Admitting: Neurosurgery

## 2019-01-29 DIAGNOSIS — M542 Cervicalgia: Secondary | ICD-10-CM

## 2019-06-24 ENCOUNTER — Emergency Department (HOSPITAL_COMMUNITY)
Admission: EM | Admit: 2019-06-24 | Discharge: 2019-06-24 | Disposition: A | Discharge status: Home / Self Care | Payer: PRIVATE HEALTH INSURANCE | Attending: Emergency Medicine | Admitting: Emergency Medicine

## 2019-06-24 ENCOUNTER — Emergency Department (HOSPITAL_COMMUNITY): Payer: PRIVATE HEALTH INSURANCE

## 2019-06-24 ENCOUNTER — Encounter (HOSPITAL_COMMUNITY): Payer: Self-pay | Admitting: Emergency Medicine

## 2019-06-24 ENCOUNTER — Other Ambulatory Visit: Payer: Self-pay

## 2019-06-24 DIAGNOSIS — R002 Palpitations: Secondary | ICD-10-CM | POA: Insufficient documentation

## 2019-06-24 DIAGNOSIS — Z733 Stress, not elsewhere classified: Secondary | ICD-10-CM | POA: Insufficient documentation

## 2019-06-24 DIAGNOSIS — E039 Hypothyroidism, unspecified: Secondary | ICD-10-CM | POA: Insufficient documentation

## 2019-06-24 LAB — TROPONIN I (HIGH SENSITIVITY)
Troponin I (High Sensitivity): 4 ng/L (ref <18)
Troponin I (High Sensitivity): 6 ng/L (ref <18)

## 2019-06-24 LAB — BASIC METABOLIC PANEL
Anion gap: 11 (ref 5–15)
BUN: 19 mg/dL (ref 8–23)
CO2: 23 mmol/L (ref 22–32)
Calcium: 9.5 mg/dL (ref 8.9–10.3)
Chloride: 104 mmol/L (ref 98–111)
Creatinine, Ser: 0.98 mg/dL (ref 0.44–1.00)
GFR calc Af Amer: >60 mL/min (ref >60)
GFR calc non Af Amer: >60 mL/min (ref >60)
Glucose, Bld: 116 mg/dL — ABNORMAL HIGH (ref 70–99)
Potassium: 3.4 mmol/L — ABNORMAL LOW (ref 3.5–5.1)
Sodium: 138 mmol/L (ref 135–145)

## 2019-06-24 LAB — CBC
HCT: 44.3 % (ref 36.0–46.0)
Hemoglobin: 14.7 g/dL (ref 12.0–15.0)
MCH: 30.9 pg (ref 26.0–34.0)
MCHC: 33.2 g/dL (ref 30.0–36.0)
MCV: 93.3 fL (ref 80.0–100.0)
Platelets: 440 10*3/uL — ABNORMAL HIGH (ref 150–400)
RBC: 4.75 MIL/uL (ref 3.87–5.11)
RDW: 12.3 % (ref 11.5–15.5)
WBC: 10.1 10*3/uL (ref 4.0–10.5)
nRBC: 0 % (ref 0.0–0.2)

## 2019-06-24 MED ORDER — SODIUM CHLORIDE 0.9% FLUSH: 3.0000 mL | Freq: Once | INTRAVENOUS | Status: DC

## 2019-06-24 NOTE — ED Notes (Signed)
Patient verbalizes understanding of discharge instructions. Opportunity for questioning and answers were provided. Armband removed by staff, pt discharged from ED.   Pt unable to sign signature pad

## 2019-06-24 NOTE — ED Provider Notes (Signed)
[facility] EMERGENCY DEPARTMENT Provider Note   CSN: [id number] Arrival date & time: 06/24/19  0355     History   Chief Complaint Chief Complaint  Patient presents with  . Palpitations    HPI [name] is a 63 y.o. female.  Presents emerged department with chief complaint of palpitations.  Reports around 3 AM this morning she had sudden onset episode of palpitations that woke her up from her sleep, states lasted around 10 to 15 minutes and spontaneously resolved.  States she checked her heart rate during the episode and it was up to 130s and irregular.  At time of my interview, she states she is completely asymptomatic, specifically denying any chest pain, ongoing palpitations, shortness of breath, lightheadedness, abdominal pain or nausea.  Patient reports couple years ago she had a similar episode.  Came to the emergency department and had an unremarkable work-up, Holter monitor from primary care doctor was also performed and was negative for any episodes of arrhythmias.  Patient does report she has been under significant stress, works as a Freight forwarder for [employer] and has had multiple family stressors as well.  States yesterday actually felt less stressed than normal.  No prior history of documented atrial fibrillation, no prior history of smoking.  Does have history of hyperlipidemia.  No recent heavy alcohol consumption.      HPI  Past Medical History:  Diagnosis Date  . GERD (gastroesophageal reflux disease)   . Hypothyroid     Patient Active Problem List   Diagnosis Date Noted  . Mucoid cyst of joint 11/20/2015  . Palpitations 09/04/2015  . HYPOTHYROIDISM 12/21/2007  . HYPERLIPIDEMIA 12/21/2007  . RHINOSINUSITIS, ALLERGIC 12/21/2007  . GERD 12/21/2007    Past Surgical History:  Procedure Laterality Date  . TONSILLECTOMY  1962  . UPPER GASTROINTESTINAL ENDOSCOPY    . WRIST FRACTURE SURGERY  2002   right wrist     OB  History   No obstetric history on file.      Home Medications    Prior to Admission medications   Medication Sig Start Date End Date Taking? Authorizing Provider  losartan-hydrochlorothiazide [facility]) 50-12.5 MG tablet  03/15/16   [provider]  methylPREDNISolone (MEDROL DOSEPAK) 4 MG TBPK tablet 6 day dose pack - take as directed 10/03/18   Garrel Ridgel, DPM  SYNTHROID 125 MCG tablet  03/25/15   [provider]    Family History Family History  Problem Relation Age of Onset  . Thyroid disease Mother   . Hypertension Mother   . Thyroid disease Sister   . Breast cancer Maternal Aunt 42  . Breast cancer Paternal Grandmother 57  . Colon cancer Neg Hx     Social History Social History   Tobacco Use  . Smoking status: Never Smoker  . Smokeless tobacco: Never Used  Substance Use Topics  . Alcohol use: No    Alcohol/week: 0.0 standard drinks  . Drug use: No     Allergies   Macrodantin [nitrofurantoin]   Review of Systems Review of Systems  Constitutional: Negative for chills and fever.  HENT: Negative for ear pain and sore throat.   Eyes: Negative for pain and visual disturbance.  Respiratory: Negative for cough and shortness of breath.   Cardiovascular: Positive for palpitations. Negative for chest pain.  Gastrointestinal: Negative for abdominal pain and vomiting.  Genitourinary: Negative for dysuria and hematuria.  Musculoskeletal: Negative for arthralgias and back pain.  Skin: Negative  for color change and rash.  Neurological: Negative for seizures and syncope.  All other systems reviewed and are negative.    Physical Exam Updated Vital Signs BP (!) 146/84   Pulse 72   Temp 97.7 F (36.5 C) (Oral)   Resp 15   SpO2 99%   Physical Exam Vitals signs and nursing note reviewed.  Constitutional:      General: She is not in acute distress.    Appearance: She is well-developed.  HENT:     Head: Normocephalic and atraumatic.  Eyes:      Conjunctiva/sclera: Conjunctivae normal.  Neck:     Musculoskeletal: Neck supple.  Cardiovascular:     Rate and Rhythm: Normal rate and regular rhythm.     Heart sounds: No murmur.  Pulmonary:     Effort: Pulmonary effort is normal. No respiratory distress.     Breath sounds: Normal breath sounds.  Abdominal:     Palpations: Abdomen is soft.     Tenderness: There is no abdominal tenderness.  Musculoskeletal:        General: No swelling or tenderness.  Skin:    General: Skin is warm and dry.     Capillary Refill: Capillary refill takes less than 2 seconds.  Neurological:     General: No focal deficit present.     Mental Status: She is alert and oriented to person, place, and time.      ED Treatments / Results  Labs (all labs ordered are listed, but only abnormal results are displayed) Labs Reviewed  BASIC METABOLIC PANEL - Abnormal; Notable for the following components:      Result Value   Potassium 3.4 (*)    Glucose, Bld 116 (*)    All other components within normal limits  CBC - Abnormal; Notable for the following components:   Platelets 440 (*)    All other components within normal limits  TROPONIN I (HIGH SENSITIVITY)  TROPONIN I (HIGH SENSITIVITY)    EKG EKG Interpretation  Date/Time:  Sunday June 24 2019 06:53:07 EDT Ventricular Rate:  80 PR Interval:  146 QRS Duration: 87 QT Interval:  363 QTC Calculation: 419 R Axis:   49 Text Interpretation:  Sinus rhythm Consider left atrial enlargement No significant change was found Confirmed by Rancour, Stephen ([id number]) on 06/24/2019 6:57:17 AM   Radiology Dg Chest 2 View  Result Date: 06/24/2019 CLINICAL DATA:  Palpitations walker out of sleep. EXAM: CHEST - 2 VIEW COMPARISON:  01/18/2016 FINDINGS: The heart size and mediastinal contours are within normal limits. Both lungs are clear. The visualized skeletal structures are unremarkable. IMPRESSION: No active cardiopulmonary disease. Electronically Signed   By:  Hetal  Patel   On: 06/24/2019 04:47    Procedures Procedures (including critical care time)  Medications Ordered in ED Medications  sodium chloride flush (NS) 0.9 % injection 3 mL (has no administration in time range)     Initial Impression / Assessment and Plan / ED Course  I have reviewed the triage vital signs and the nursing notes.  Pertinent labs & imaging results that were available during my care of the patient were reviewed by me and considered in my medical decision making (see chart for details).        63  year old lady past medical history hyperlipidemia presents to ER with episode of palpitations, brief, resolved.  She remained in sinus rhythm while in the emergency department, EKG today without ischemic changes.  Serial troponin within normal limits, lab work unremarkable, noted very  mild hypokalemia.  Patient had similar episode couple years ago but did not have any documented episodes of arrhythmias.  Given the episode was brief and resolved and has not recurred, and patient remains asymptomatic with normal vital signs now, I believe she is appropriate for further management as outpatient.  I recommended recheck with your primary doctor for further discussion of getting an outpatient Holter monitor.  Reviewed return precautions with patient in detail.  Will discharge home.  Final Clinical Impressions(s) / ED Diagnoses   Final diagnoses:  Palpitations    ED Discharge Orders    None       Lucrezia Starch, MD 06/24/19 [phone]

## 2019-06-24 NOTE — ED Triage Notes (Signed)
Patient here with palpitations that woke her out of sleep.  She states that she just does not feel right, that her heart rate was very fast when she woke up.  Patient is CAOx4, denies any pain at this time.  She does have mild dizziness at this time.  No nausea or vomiting.  She has an uneasy feeling.

## 2019-06-24 NOTE — Discharge Instructions (Signed)
If you develop recurrence of palpitations, any chest pain or shortness of breath, please return to the emergency department for reevaluation.  Recommend following up with your primary doctor and discussing need for repeat Holter monitor examination.  Please call their office on Monday to schedule a follow-up appointment within the next few days.

## 2019-07-03 ENCOUNTER — Telehealth: Payer: Self-pay | Admitting: *Deleted

## 2019-07-03 ENCOUNTER — Other Ambulatory Visit: Payer: Self-pay | Admitting: *Deleted

## 2019-07-03 DIAGNOSIS — R002 Palpitations: Secondary | ICD-10-CM

## 2019-07-03 NOTE — Telephone Encounter (Signed)
Called to set up cardiac event monitor.  No answer, NO DPR.

## 2019-07-05 ENCOUNTER — Telehealth: Payer: Self-pay | Admitting: *Deleted

## 2019-07-05 NOTE — Telephone Encounter (Signed)
Follow Up:   Pt calling to find out the status of her monitor.

## 2019-07-05 NOTE — Telephone Encounter (Signed)
[employer] to ship a 30 day cardiac event monitor to the patients home.  Instructions reviewed briefly as they are included in the monitor kit. 

## 2019-07-14 ENCOUNTER — Ambulatory Visit (INDEPENDENT_AMBULATORY_CARE_PROVIDER_SITE_OTHER): Payer: PRIVATE HEALTH INSURANCE

## 2019-07-14 DIAGNOSIS — R002 Palpitations: Secondary | ICD-10-CM | POA: Diagnosis not present

## 2019-08-06 ENCOUNTER — Telehealth: Payer: Self-pay | Admitting: *Deleted

## 2019-08-06 NOTE — Telephone Encounter (Signed)
Left message to call back in regards to appointment 08/07/19.  Question whether patient is wearing 30 day monitor or has she received monitor . May need to reschedule f/u appointment.

## 2019-08-07 ENCOUNTER — Ambulatory Visit: Payer: PRIVATE HEALTH INSURANCE | Admitting: Cardiology

## 2019-08-07 NOTE — Telephone Encounter (Signed)
Spoke to patient . She has one more week to wear  Monitor - patient is aware would like to reschedule appointment until after  Monitor is complete.  @ 28 th of the month . Reschedule for  Dec 17 ,2020 at 11:40 am

## 2019-09-06 ENCOUNTER — Encounter: Payer: Self-pay | Admitting: Cardiology

## 2019-09-06 ENCOUNTER — Ambulatory Visit (INDEPENDENT_AMBULATORY_CARE_PROVIDER_SITE_OTHER): Payer: PRIVATE HEALTH INSURANCE | Admitting: Cardiology

## 2019-09-06 ENCOUNTER — Other Ambulatory Visit: Payer: Self-pay

## 2019-09-06 VITALS — BP 114/88 | HR 77 | Temp 97.5°F | Ht 66.0 in | Wt 212.4 lb

## 2019-09-06 DIAGNOSIS — R002 Palpitations: Secondary | ICD-10-CM | POA: Diagnosis not present

## 2019-09-06 NOTE — Progress Notes (Signed)
Primary Care Provider: Shon Baton, MD Cardiologist: No primary care provider on file. Electrophysiologist:   Clinic Note: Chief Complaint  Patient presents with  . New Patient (Initial Visit)    Palpitations    HPI:    [name] is a 63 y.o. female who is being seen today for the evaluation of PALPITATIONS at the request of Shon Baton, MD.  [name] was seen by Dr. Virgina Jock back in October for can during episodes of being awakened by forceful pounding and irregular heartbeats.  An event monitor was ordered (results below), and cardiology referral placed.  Recent Hospitalizations:   ER in April 2017 for palpitations and June 24, 2019 for palpitations.   Reviewed  CV studies:    The following studies were reviewed today: (if available, images/films reviewed: From Epic Chart or Care Everywhere) . Holter monitor December 2016: Relatively normal.  Rare PACs.  No arrhythmias. . August 20, 2019, Event Monitor: Sinus rhythm.  Average rate 65 bpm.  Range from 50 to 140 bpm.  Rare PACs with minimal PVCs.  Symptoms noted with mostly sinus rhythm occasionally with PACs or PVCs.  No arrhythmias (either rapid or, and no pauses.) . June 2008, Lexiscan Myoview: Normal perfusion.  No ischemia or infarction.  EF> 75%. . TTE July 2008: Normal LV size and function.  EF 55-65%.  No RWMA.  Normal valves.  Normal echo.   Interval History:   [name] is a very pleasant 63 year old woman with history of acquired hypothyroidism after intermittent bouts of hyper and hypothyroidism in the past.  She is now on Synthroid.  She takes Hyzaar for blood pressure.  Otherwise no significant past medical history. She relates the story of October 4 that she woke up early in the morning with a episode of forceful irregular heartbeats.  It lasted about 10 to 15 minutes per her report.  She did not describe it as being fast, but more irregular sort of flopping pounding sensation.  It resolved  spontaneously without her doing anything.  She did not really get up to do anything and therefore not sure if it would be made worse with exertion or better.  She felt lightheaded but had no loss of consciousness.  No sensation of near syncope.  She did feel little bit queasy little bit nauseated.  No vomiting.  About the living she can tell me different about the preceding night's that she had had a very heavy meal with lots of pasta and red sauce, with meat and cheese along with a dessert.  Probably more than she usually would eat.  She denies eating any wine because that makes her feel bad.  Since then she has had several fleeting episodes where she feels similar sensation but not nearly as long lasting.  The concerning feature was that it woke her up from sleep and had her quite scared.  At baseline she is pretty active walks anywhere from 2-3 miles a day most days a week.  Because all of her housework activities denies any chest tightness or pressure with rest or exertion.  She has not had any syncope or near syncope episodes even despite this 1 episode of abnormal heartbeats..   CV Review of Symptoms (Summary): no chest pain or dyspnea on exertion positive for - irregular heartbeat, palpitations and Associated with lightheadedness and dizziness, but no syncope or near syncope. negative for - chest pain, dyspnea on exertion, edema, orthopnea, paroxysmal nocturnal dyspnea, rapid heart rate, shortness  of breath or TIA/amaurosis fugax, claudication  The patient does not have symptoms concerning for COVID-19 infection (fever, chills, cough, or new shortness of breath).  The patient is practicing social distancing. ++ Masking.  She does wear her mask, gloves and is distancing with groceries/shopping.  Measures in place at work for [employer] was performed. Review of Systems  Constitutional: Negative for malaise/fatigue and weight loss.  HENT:  Negative for congestion and nosebleeds.   Respiratory: Negative for cough and shortness of breath.   Gastrointestinal: Negative for blood in stool, heartburn and melena.  Genitourinary: Negative for hematuria.  Musculoskeletal: Negative for falls and joint pain.  Neurological: Positive for dizziness (Only during that 1 episode). Negative for tingling, loss of consciousness and weakness.  Psychiatric/Behavioral: Negative.  Negative for memory loss. The patient is not nervous/anxious and does not have insomnia.   All other systems reviewed and are negative.   I have reviewed and (if needed) personally updated the patient's problem list, medications, allergies, past medical and surgical history, social and family history.   PAST MEDICAL HISTORY   Past Medical History:  Diagnosis Date  . GERD (gastroesophageal reflux disease)   . Hypothyroid    Acquired.  Has had radiofrequency ablation     PAST SURGICAL HISTORY   Past Surgical History:  Procedure Laterality Date  . TONSILLECTOMY  1962  . UPPER GASTROINTESTINAL ENDOSCOPY    . WRIST FRACTURE SURGERY  2002   right wrist     MEDICATIONS/ALLERGIES   Current Meds  Medication Sig  . losartan-hydrochlorothiazide (HYZAAR) 50-12.5 MG tablet   . SYNTHROID 125 MCG tablet     Allergies  Allergen Reactions  . Macrodantin [Nitrofurantoin] Hives    Shortness of breath     SOCIAL HISTORY/FAMILY HISTORY   Social History   Tobacco Use  . Smoking status: Never Smoker  . Smokeless tobacco: Never Used  Substance Use Topics  . Alcohol use: No    Alcohol/week: 0.0 standard drinks  . Drug use: No   Social History   Social History Narrative   Married with 1 child.  No grandchildren.  Lives with her spouse.   She is quite active.  Quit smoking in 2003.   Walks 3 miles a day 7 days a week for about an hour 50 minutes a day    Family History family history includes Breast cancer (age of onset: 34) in her maternal aunt; Breast  cancer (age of onset: 41) in her paternal grandmother; Cancer in her father; High Cholesterol in her brother, father, and sister; Hypertension in her brother and mother; Thyroid disease in her mother and sister.   OBJCTIVE -PE, EKG, labs   Wt Readings from Last 3 Encounters:  09/06/19 212 lb 6.4 oz (96.3 kg)  05/01/18 210 lb (95.3 kg)  01/18/16 209 lb 6 oz (95 kg)    Physical Exam: BP 114/88 (BP Location: Left Arm, Patient Position: Sitting, Cuff Size: Large)   Pulse 77   Temp (!) 97.5 F (36.4 C)   Ht 5\' 6"  (1.676 m)   Wt 212 lb 6.4 oz (96.3 kg)   BMI 34.28 kg/m  Physical Exam  Constitutional: She is oriented to person, place, and time. She appears well-developed and well-nourished. No distress.  HENT:  Head: Normocephalic and atraumatic.  Eyes: Pupils are equal, round, and reactive to light. Conjunctivae and EOM are normal.  Neck: No hepatojugular reflux and no JVD present. Carotid bruit  is not present. No tracheal deviation present. No thyromegaly present.  Cardiovascular: Normal rate, regular rhythm, normal heart sounds and intact distal pulses.  Occasional extrasystoles are present. PMI is not displaced. Exam reveals no gallop and no friction rub.  No murmur heard. Abdominal: Soft. Bowel sounds are normal. She exhibits no distension. There is no abdominal tenderness. There is no rebound.  Musculoskeletal:        General: No edema. Normal range of motion.     Cervical back: Normal range of motion and neck supple.  Neurological: She is alert and oriented to person, place, and time. No cranial nerve deficit.  Psychiatric: She has a normal mood and affect. Her behavior is normal. Judgment and thought content normal.  Vitals reviewed.   Adult ECG Report Not checked  Recent Labs:    Lipids from January 15, 2019: TC 297, TG 207, HDL 48, LDL 208.  A1c 5.4.  CR 0.98.  Hgb 14.7.  Potassium 3.4.  TSH 0.35.  Lab Results  Component Value Date   CREATININE 0.98 06/24/2019   BUN 19  06/24/2019   NA 138 06/24/2019   K 3.4 (L) 06/24/2019   CL 104 06/24/2019   CO2 23 06/24/2019    ASSESSMENT/PLAN    Problem List Items Addressed This Visit    Palpitations    Not much noted on monitor.  Rare PACs and PVCs noted.  Not exactly sure what the episode was, however, we may simply not have captured it on her event monitor.  As rare as his episodes are I think a more portable monitor is that her option.  We gave her several options of personal portable monitors noted below including Kardia by [employer] & [employer].   At this point I do not know that I would necessarily doing need to treat.  With no idea what it is were dealing with, would probably wait to do any further testing unless I know what it is.  We could consider an echocardiogram to evaluate for any structural abnormalities if there was any idea what were seen, but her exam is relatively benign.           Based on these findings, I see no reason for her not to have any other procedures such as GI procedures etc.   COVID-19 Education: The signs and symptoms of COVID-19 were discussed with the patient and how to seek care for testing (follow up with PCP or arrange E-visit).   The importance of social distancing was discussed today.  I spent a total of 25 minutes with the patient and chart review. >  50% of the time was spent in direct patient consultation.  Additional time spent with chart review (studies, outside notes, etc): 10 Total Time: 35 min   Current medicines are reviewed at length with the patient today.  (+/- concerns) n/a   Patient Instructions / Medication Changes & Studies & Tests Ordered   Patient Instructions  Medication Instructions:  No changes    Lab Work: Not needed If you have labs (blood work) drawn today and your tests are completely normal, you will receive your results only by: Marland Kitchen MyChart Message (if you have MyChart) OR . A paper copy in the mail If you have any lab test that is  abnormal or we need to change your treatment, we will call you to review the results.  Testing/Procedures: Not needed  Follow-Up: At [facility], you and your health needs are our priority.  As  part of our continuing mission to provide you with exceptional heart care, we have created designated Provider Care Teams.  These Care Teams include your primary Cardiologist (physician) and Advanced Practice Providers (APPs -  Physician Assistants and Nurse Practitioners) who all work together to provide you with the care you need, when you need it.  Your next appointment:   6 month(s)  The format for your next appointment:   Virtual Visit   Provider:   Glenetta Hew, MD  Other Instructions    DR Ellyn Hack RECOMMENDS THAT YOU  PURCHASES  " Kardia" By [employer] FROM THE  GOOGLE[employer]  APP PLAY STORE.   THE APP IS FREE , BUT THE  EQUIPMENT HAS A COST. IT IS A WAY FOR YOU TO OBTAIN A RECORDING OF YOUR HEART RATE . IT WILL BE A SHORT RHYTHM  STRIP THAT YOU CAN SHOW TO MEDICAL STAFF.  ALSO , YOU MAY [place] - EKG- MONITOR YOU ARE ABLE TO KEEP RECORDINGS ON YOUR SMARTPHONE , INSTEAD OF PURCHASING A SEPARATE EQUIPMENT   Dr Ellyn Hack  recommends portable  ECG monitor  to capture heart rate reading. There is a monitor that can be purchased on [employer]. The  monitor name is "EMAY"  at last checked it cost $79.  Our understanding there no more cost of that purchasing the machine. You will need a computer to be able to upload your recording and to be printed. It also come with a  USB to charge the monitor.  Studies Ordered:   No orders of the defined types were placed in this encounter.    Glenetta Hew, M.D., M.S. Interventional Cardiologist   Pager # [phone] Phone # [phone] [address] [place], [place] [address]   Thank you for choosing Heartcare at [facility]!!

## 2019-09-06 NOTE — Patient Instructions (Signed)
Medication Instructions:  No changes    Lab Work: Not needed If you have labs (blood work) drawn today and your tests are completely normal, you will receive your results only by: Marland Kitchen MyChart Message (if you have MyChart) OR . A paper copy in the mail If you have any lab test that is abnormal or we need to change your treatment, we will call you to review the results.  Testing/Procedures: Not needed  Follow-Up: At [facility], you and your health needs are our priority.  As part of our continuing mission to provide you with exceptional heart care, we have created designated Provider Care Teams.  These Care Teams include your primary Cardiologist (physician) and Advanced Practice Providers (APPs -  Physician Assistants and Nurse Practitioners) who all work together to provide you with the care you need, when you need it.  Your next appointment:   6 month(s)  The format for your next appointment:   Virtual Visit   Provider:   Glenetta Hew, MD  Other Instructions    DR Ellyn Hack RECOMMENDS THAT YOU  PURCHASES  " Kardia" By [employer] FROM THE  [employer]  APP PLAY STORE.   THE APP IS FREE , BUT THE  EQUIPMENT HAS A COST. IT IS A WAY FOR YOU TO OBTAIN A RECORDING OF YOUR HEART RATE . IT WILL BE A SHORT RHYTHM  STRIP THAT YOU CAN SHOW TO MEDICAL STAFF.  ALSO , YOU MAY [place] - EKG- MONITOR YOU ARE ABLE TO KEEP RECORDINGS ON YOUR SMARTPHONE , INSTEAD OF PURCHASING A SEPARATE EQUIPMENT   Dr Ellyn Hack  recommends portable  ECG monitor  to capture heart rate reading. There is a monitor that can be purchased on [employer]. The  monitor name is "EMAY"  at last checked it cost $79.  Our understanding there no more cost of that purchasing the machine. You will need a computer to be able to upload your recording and to be printed. It also come with a  USB to charge the monitor.

## 2019-09-08 ENCOUNTER — Encounter: Payer: Self-pay | Admitting: Cardiology

## 2019-09-08 NOTE — Assessment & Plan Note (Signed)
Not much noted on monitor.  Rare PACs and PVCs noted.  Not exactly sure what the episode was, however, we may simply not have captured it on her event monitor.  As rare as his episodes are I think a more portable monitor is that her option.  We gave her several options of personal portable monitors noted below including Kardia by [employer] & [employer].   At this point I do not know that I would necessarily doing need to treat.  With no idea what it is were dealing with, would probably wait to do any further testing unless I know what it is.  We could consider an echocardiogram to evaluate for any structural abnormalities if there was any idea what were seen, but her exam is relatively benign.

## 2019-12-19 ENCOUNTER — Telehealth: Payer: Self-pay | Admitting: *Deleted

## 2019-12-19 NOTE — Telephone Encounter (Signed)
Pt states Dr. Milinda Pointer had treated her for gout and she was give a pack and would like a refill for the current gout flare.

## 2019-12-20 ENCOUNTER — Telehealth: Payer: Self-pay | Admitting: *Deleted

## 2019-12-20 MED ORDER — METHYLPREDNISOLONE 4 MG PO TBPK: ORAL_TABLET | ORAL | 0 refills | Status: DC

## 2019-12-20 NOTE — Telephone Encounter (Signed)
Pt is requesting steroid pack for gout.

## 2019-12-20 NOTE — Telephone Encounter (Signed)
Dr. Trinidad Curet the medrol dose pack. Left message informing pt that the doctor on call had refilled the medrol dose pack and she had not been seen in a year and if she continued to have problems she would need an appt prior to future refills.

## 2020-02-13 NOTE — Progress Notes (Signed)
Virtual Visit via Telephone Note   This visit type was conducted due to national recommendations for restrictions regarding the COVID-19 Pandemic (e.g. social distancing) in an effort to limit this patient's exposure and mitigate transmission in our community.  Due to her co-morbid illnesses, this patient is at least at moderate risk for complications without adequate follow up.  This format is felt to be most appropriate for this patient at this time.  The patient did not have access to video technology/had technical difficulties with video requiring transitioning to audio format only (telephone).  All issues noted in this document were discussed and addressed.  No physical exam could be performed with this format.  Please refer to the patient's chart for her  consent to telehealth for [facility].   Date:  02/14/2020   ID:  [name], DOB January 06, 1956, MRN [id number]  Patient Location: Home Provider Location: Home  PCP:  Shon Baton, MD  Cardiologist:  Dr. Ellyn Hack  Electrophysiologist:  None   Evaluation Performed:  Follow-Up Visit  Chief Complaint:  Palpitations  History of Present Illness:    [name] is a 64 y.o. female with for ongoing assessment and management of palpitations, with Holter monitor December 2016 normal with rare PACs.  Average rate 65 bpm with a range of 50 to 140 bpm.  No arrhythmias.  She has a history of hypothyroidism and GERD.  Patient had a Lexiscan Myoview showing normal perfusion with no evidence of ischemia or infarction in June 2008.  She also had an echocardiogram in July 2008 which revealed normal LV size and function with an EF of 55 to 65% with no wall motion abnormalities.  Was last seen by Dr. Ellyn Hack on 09/06/2019.  At that time she reported awakening with several forceful irregular heartbeats lasting 10 to 15 minutes.  They resolve spontaneously.  She also states that she had had a heavy meal with a lot of pasta and red sauce along with  dessert prior to this occurring.  The patient is very active walking anywhere from 2 to 3 miles a day on most days of the week.  She is able to complete all of her activities around her home.  She denies any associated shortness of breath dizziness or near syncope associated with irregular heart rhythm or heart racing.  On that office visit Dr. Ellyn Hack did not feel that there was a need to treat these episodes as she was not having any sequela.  He reassured her that these were relatively benign and unless new symptoms appear of chest pain, shortness of breath, or dizziness no further testing was recommended.  Today she is complaining of worsening symptoms again.  She was feeling the palpitations at night when she went to bed was irregular heart rate and thumping.  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  This is become worrisome to her because it was not happening during the day before.  She is not having any associated symptoms with it but because is more noticeable she is curious about why this is changed.  She has recently been seen by her primary care physician with labs that were all within normal limits.  Her TSH was within normal limits.  Her blood pressure was slightly elevated today and she is medically compliant with losartan/HCTZ.  She denies use of caffeine, OTC pseudoephedrine, energy drinks, or increased stress.   The patient does not have symptoms concerning for COVID-19 infection (  fever, chills, cough, or new shortness of breath).    Past Medical History:  Diagnosis Date  . GERD (gastroesophageal reflux disease)   . Hypothyroid    Acquired.  Has had radiofrequency ablation   Past Surgical History:  Procedure Laterality Date  . TONSILLECTOMY  1962  . UPPER GASTROINTESTINAL ENDOSCOPY    . WRIST FRACTURE SURGERY  2002   right wrist     Current Meds  Medication Sig  . ezetimibe (ZETIA) 10 MG tablet Take 10 mg by mouth daily.  Marland Kitchen  losartan-hydrochlorothiazide (HYZAAR) 50-12.5 MG tablet Take 1 tablet by mouth daily.   . pantoprazole (PROTONIX) 40 MG tablet Take 40 mg by mouth daily.  Marland Kitchen SYNTHROID 137 MCG tablet Take 137 mcg by mouth daily.     Allergies:   Macrodantin [nitrofurantoin]   Social History   Tobacco Use  . Smoking status: Never Smoker  . Smokeless tobacco: Never Used  Substance Use Topics  . Alcohol use: No    Alcohol/week: 0.0 standard drinks  . Drug use: No     Family Hx: The patient's family history includes Breast cancer (age of onset: 54) in her maternal aunt; Breast cancer (age of onset: 62) in her paternal grandmother; Cancer in her father; High Cholesterol in her brother, father, and sister; Hypertension in her brother and mother; Thyroid disease in her mother and sister. There is no history of Colon cancer.  ROS:   Please see the history of present illness.    All other systems reviewed and are negative.   Prior CV studies:   The following studies were reviewed today: Study Highlights 08/20/2019    Overall relative normal monitor: Mostly sinus rhythm with rates as low as 48 bpm as high as 140 bpm. Average 70 bpm.  No critical or serious events noted. 14 stable events occurred there were manually detected.  With the exception of one episode involving PVCs and one involving PACs, all other rhythms were sinus.  No arrhythmias-tachycardia or bradycardia (other than sinus bradycardia)  No pauses.  Bradycardia was noted during sleeping hours.   Overall stable monitor.  Relatively normal.  No abnormal findings.      Labs/Other Tests and Data Reviewed:    EKG:  No ECG reviewed.  Recent Labs: 06/24/2019: BUN 19; Creatinine, Ser 0.98; Hemoglobin 14.7; Platelets 440; Potassium 3.4; Sodium 138   Recent Lipid Panel Lab Results  Component Value Date/Time   CHOL (H) 03/18/2007 01:15 AM    224        ATP III CLASSIFICATION:  <200     mg/dL   Desirable  200-239  mg/dL    Borderline High  >=240    mg/dL   High   TRIG 107 03/18/2007 01:15 AM   HDL 61 03/18/2007 01:15 AM   CHOLHDL 3.7 03/18/2007 01:15 AM   LDLCALC (H) 03/18/2007 01:15 AM    142        Total Cholesterol/HDL:CHD Risk Coronary Heart Disease Risk Table                     Men   Women  1/2 Average Risk   3.4   3.3    Wt Readings from Last 3 Encounters:  02/14/20 210 lb (95.3 kg)  09/06/19 212 lb 6.4 oz (96.3 kg)  05/01/18 210 lb (95.3 kg)     Objective:    Vital Signs:  BP (!) 156/97   Pulse 78   Ht 5\' 6"  (1.676 m)  Wt 210 lb (95.3 kg)   BMI 33.89 kg/m    VITAL SIGNS:  reviewed GEN:  no acute distress RESPIRATORY:  normal respiratory effort, symmetric expansion NEURO:  alert and oriented x 3, no obvious focal deficit PSYCH:  normal affect  ASSESSMENT & PLAN:    1. Palpitations: Increasing frequency and duration reported by [name].  She is feeling it during the day which is new for her as well as in the evening which she had experienced in the past.  She did have a period of time where this had subsided but now has noticed return of irregular heart rate, and also daytime palpitations which are new.        She is recently had lab work by her primary care physician.  Her potassium was 5.1.  TSH was within normal limits.  I will place a 2-week monitor on her to evaluate for frequency and morphology of her irregular heart rhythm to evaluate further since she is experiencing it during the daytime now.  I will also start her on low-dose magnesium glycinate or magnesium malate, over-the-counter which she will take at at bedtime (400 mg).  I told her not to take more than 1 a day to avoid diarrhea.  We will see her on follow up in 6 weeks to discuss results and her symptoms.  2. Hypertension;  She is slightly elevated today. Continue lostartan/HCTZ 50/12/5 mg daily.    3. Hypothyroidism: She is followed by PCP with recent labs.   4.  Hypercholesterolemia: On Zetia. Labs per PCP.     COVID-19 Education: The signs and symptoms of COVID-19 were discussed with the patient and how to seek care for testing (follow up with PCP or arrange E-visit).  The importance of social distancing was discussed today.  Time:   Today, I have spent 25 minutes with the patient with telehealth technology discussing the above problems.     Medication Adjustments/Labs and Tests Ordered: Current medicines are reviewed at length with the patient today.  Concerns regarding medicines are outlined above.   Tests Ordered: No orders of the defined types were placed in this encounter.   Medication Changes: No orders of the defined types were placed in this encounter.   Disposition:  Follow up 6 weeks.   Signed, Phill Myron. West Pugh, ANP, [facility]  02/14/2020 8:36 AM    [facility]

## 2020-02-14 ENCOUNTER — Encounter: Payer: Self-pay | Admitting: Adult Health

## 2020-02-14 ENCOUNTER — Telehealth (INDEPENDENT_AMBULATORY_CARE_PROVIDER_SITE_OTHER): Payer: Managed Care, Other (non HMO) | Admitting: Adult Health

## 2020-02-14 ENCOUNTER — Telehealth: Payer: Self-pay | Admitting: Radiology

## 2020-02-14 VITALS — BP 156/97 | HR 78 | Ht 66.0 in | Wt 210.0 lb

## 2020-02-14 DIAGNOSIS — R002 Palpitations: Secondary | ICD-10-CM

## 2020-02-14 DIAGNOSIS — K219 Gastro-esophageal reflux disease without esophagitis: Secondary | ICD-10-CM | POA: Diagnosis not present

## 2020-02-14 DIAGNOSIS — E039 Hypothyroidism, unspecified: Secondary | ICD-10-CM

## 2020-02-14 DIAGNOSIS — E785 Hyperlipidemia, unspecified: Secondary | ICD-10-CM

## 2020-02-14 DIAGNOSIS — I10 Essential (primary) hypertension: Secondary | ICD-10-CM

## 2020-02-14 NOTE — Patient Instructions (Signed)
Medication Instructions:  Continue current medication  *If you need a refill on your cardiac medications before your next appointment, please call your pharmacy*   Lab Work: None Ordered   Testing/Procedures: Your physician has recommended that you wear a Zio monitor. Holter monitors are medical devices that record the heart's electrical activity. Doctors most often use these monitors to diagnose arrhythmias. Arrhythmias are problems with the speed or rhythm of the heartbeat. The monitor is a small, portable device. You can wear one while you do your normal daily activities. This is usually used to diagnose what is causing palpitations/syncope (passing out).   Follow-Up: At [facility], you and your health needs are our priority.  As part of our continuing mission to provide you with exceptional heart care, we have created designated Provider Care Teams.  These Care Teams include your primary Cardiologist (physician) and Advanced Practice Providers (APPs -  Physician Assistants and Nurse Practitioners) who all work together to provide you with the care you need, when you need it.  We recommend signing up for the patient portal called "MyChart".  Sign up information is provided on this After Visit Summary.  MyChart is used to connect with patients for Virtual Visits (Telemedicine).  Patients are able to view lab/test results, encounter notes, upcoming appointments, etc.  Non-urgent messages can be sent to your provider as well.   To learn more about what you can do with MyChart, go to [email or web address].    Your next appointment:   Thursday July 8th @ 8:00 am  The format for your next appointment:   Virtual visit  Provider:   Glenetta Hew, MD   Other Instructions  ZIO XT- Long Term Monitor Instructions   Your physician has requested you wear your ZIO patch monitor 14 days.   This is a single patch monitor.  [employer] supplies one patch monitor per enrollment.  Additional  stickers are not available.   Please do not apply patch if you will be having a Nuclear Stress Test, Echocardiogram, Cardiac CT, MRI, or Chest Xray during the time frame you would be wearing the monitor. The patch cannot be worn during these tests.  You cannot remove and re-apply the ZIO XT patch monitor.   Your ZIO patch monitor will be sent USPS Priority mail from [facility] directly to your home address. The monitor may also be mailed to a PO BOX if home delivery is not available.   It may take 3-5 days to receive your monitor after you have been enrolled.   Once you have received you monitor, please review enclosed instructions.  Your monitor has already been registered assigning a specific monitor serial # to you.   Applying the monitor   Shave hair from upper left chest.   Hold abrader disc by orange tab.  Rub abrader in 40 strokes over left upper chest as indicated in your monitor instructions.   Clean area with 4 enclosed alcohol pads .  Use all pads to assure are is cleaned thoroughly.  Let dry.   Apply patch as indicated in monitor instructions.  Patch will be place under collarbone on left side of chest with arrow pointing upward.   Rub patch adhesive wings for 2 minutes.Remove white label marked "1".  Remove white label marked "2".  Rub patch adhesive wings for 2 additional minutes.   While looking in a mirror, press and release button in center of patch.  A small green light will flash 3-4 times .  This  will be your only indicator the monitor has been turned on.     Do not shower for the first 24 hours.  You may shower after the first 24 hours.   Press button if you feel a symptom. You will hear a small click.  Record Date, Time and Symptom in the Patient Log Book.   When you are ready to remove patch, follow instructions on last 2 pages of Patient Log Book.  Stick patch monitor onto last page of Patient Log Book.   Place Patient Log Book in [place] box.  Use locking  tab on box and tape box closed securely.  The [employer] and [employer] has [employer] on it.  Please place in mailbox as soon as possible.  Your physician should have your test results approximately 7 days after the monitor has been mailed back to [facility].   Call [facility] at [phone] if you have questions regarding your ZIO XT patch monitor.  Call them immediately if you see an orange light blinking on your monitor.   If your monitor falls off in less than 4 days contact our Monitor department at [phone].  If your monitor becomes loose or falls off after 4 days call [employer] at [phone] for suggestions on securing your monitor.

## 2020-02-14 NOTE — Telephone Encounter (Signed)
Enrolled patient for a 14 day Zio AT monitor to be mailed to patients home.  

## 2020-02-25 ENCOUNTER — Encounter (INDEPENDENT_AMBULATORY_CARE_PROVIDER_SITE_OTHER): Payer: Managed Care, Other (non HMO)

## 2020-02-25 DIAGNOSIS — R002 Palpitations: Secondary | ICD-10-CM | POA: Diagnosis not present

## 2020-03-05 ENCOUNTER — Other Ambulatory Visit: Payer: Self-pay

## 2020-03-05 ENCOUNTER — Encounter: Payer: Self-pay | Admitting: Podiatry

## 2020-03-05 ENCOUNTER — Ambulatory Visit (INDEPENDENT_AMBULATORY_CARE_PROVIDER_SITE_OTHER): Payer: Managed Care, Other (non HMO) | Admitting: Podiatry

## 2020-03-05 DIAGNOSIS — M7752 Other enthesopathy of left foot: Secondary | ICD-10-CM | POA: Diagnosis not present

## 2020-03-05 DIAGNOSIS — M109 Gout, unspecified: Secondary | ICD-10-CM | POA: Diagnosis not present

## 2020-03-05 MED ORDER — COLCHICINE 0.6 MG PO TABS: 0.6000 mg | ORAL_TABLET | Freq: Two times a day (BID) | ORAL | 1 refills | Status: DC

## 2020-03-05 MED ORDER — METHYLPREDNISOLONE 4 MG PO TBPK: ORAL_TABLET | ORAL | 0 refills | Status: DC

## 2020-03-07 ENCOUNTER — Encounter: Payer: Self-pay | Admitting: Podiatry

## 2020-03-07 ENCOUNTER — Telehealth: Payer: Self-pay | Admitting: *Deleted

## 2020-03-07 DIAGNOSIS — M109 Gout, unspecified: Secondary | ICD-10-CM

## 2020-03-07 DIAGNOSIS — M7752 Other enthesopathy of left foot: Secondary | ICD-10-CM

## 2020-03-07 NOTE — Telephone Encounter (Signed)
Faxed required form, clinicals and demographics to [facility]. 

## 2020-03-07 NOTE — Progress Notes (Signed)
Subjective:  Patient ID: [name], female    DOB: [birth year],  MRN: [id number]  Chief Complaint  Patient presents with  . Foot Pain    pt states she has been Dx with gout before- she states she could possible be having a flare up again this time it is alot worse- started Monday. she has been treated with Medrol pack before for pain     64 y.o. female presents with the above complaint.  Patient presents with a flareup of gout to the left first metatarsophalangeal joint.  Patient states that it feels the same as the last flareup she had last year that was treated by Dr. Milinda Pointer.  Patient states it feels swollen burning there is redness to it.  Patient states when applying pressure and feels like it exploded.  Patient has bradycardia Medrol Dosepak which has helped a lot in the past.  She would like to know if he can undergo gout treatment.  She does not states that she did have an intake in red meat diet as she was on vacation.  She denies any other acute complaints.  Her flareups are well controlled and happens very rarely.   Review of Systems: Negative except as noted in the HPI. Denies N/V/F/Ch.  Past Medical History:  Diagnosis Date  . GERD (gastroesophageal reflux disease)   . Hypothyroid    Acquired.  Has had radiofrequency ablation    Current Outpatient Medications:  .  colchicine 0.6 MG tablet, Take 1 tablet (0.6 mg total) by mouth 2 (two) times daily., Disp: 60 tablet, Rfl: 1 .  ezetimibe (ZETIA) 10 MG tablet, Take 10 mg by mouth daily., Disp: , Rfl:  .  losartan-hydrochlorothiazide (HYZAAR) 50-12.5 MG tablet, Take 1 tablet by mouth daily. , Disp: , Rfl:  .  methylPREDNISolone (MEDROL DOSEPAK) 4 MG TBPK tablet, Use as directed, Disp: 1 each, Rfl: 0 .  pantoprazole (PROTONIX) 40 MG tablet, Take 40 mg by mouth daily., Disp: , Rfl:  .  SYNTHROID 137 MCG tablet, Take 137 mcg by mouth daily., Disp: , Rfl:   Social History   Tobacco Use  Smoking Status Never Smoker  Smokeless  Tobacco Never Used    Allergies  Allergen Reactions  . Macrodantin [Nitrofurantoin] Hives    Shortness of breath   Objective:  There were no vitals filed for this visit. There is no height or weight on file to calculate BMI. Constitutional Well developed. Well nourished.  Vascular Dorsalis pedis pulses palpable bilaterally. Posterior tibial pulses palpable bilaterally. Capillary refill normal to all digits.  No cyanosis or clubbing noted. Pedal hair growth normal.  Neurologic Normal speech. Oriented to person, place, and time. Epicritic sensation to light touch grossly present bilaterally.  Dermatologic Nails well groomed and normal in appearance. No open wounds. No skin lesions.  Orthopedic:  Pain on palpation to the left first metatarsophalangeal joint.  Pain with range of motion of the metatarsophalangeal joint.  Clinically examination shows red hot swollen joint.  No intra-articular pain.  No crepitus noted.   Radiographs: None Assessment:   1. Capsulitis of metatarsophalangeal (MTP) joint of left foot   2. Acute gout involving toe of left foot, unspecified cause    Plan:  Patient was evaluated and treated and all questions answered.  Left first metatarsophalangeal joint gout flare -I explained to the patient the etiology of gout flare and its relationship with the first metatarsophalangeal joint.  I explained to her that this is the most common area of  the foot that he gets affected.  Given that patient has had only 1 flareup last year, I believe that patient gout can be managed with diet control.  I discussed with the patient the importance of diet control.  Patient states understanding. -Colchicine was dispensed. -Medrol Dosepak was dispensed. -I believe patient will benefit from rheumatology follow-up for further evaluation and possibly long-term management with allopurinol. -I also believe patient will benefit from a steroid injection to help decrease the acute  inflammatory component associated with pain.  Patient states understanding would like to proceed with injection. -A steroid injection was performed at left first metatarsophalangeal joint using 1% plain Lidocaine and 10 mg of Kenalog. This was well tolerated.   No follow-ups on file.

## 2020-03-07 NOTE — Telephone Encounter (Signed)
-----   Message from [name], DPM sent at 03/07/2020  6:26 AM EDT ----- Regarding: Rheumatology follow-up Hi [name],  Can you make a rheumatology follow-up for this patient for gout work-up and possible long-term management with allopurinol.  Thank you

## 2020-03-10 ENCOUNTER — Telehealth: Payer: Self-pay | Admitting: *Deleted

## 2020-03-10 NOTE — Telephone Encounter (Signed)
Entered in error

## 2020-03-10 NOTE — Telephone Encounter (Addendum)
[facility] - Referral Coordinator- Anderson Malta request all labs from 10/03/2018. I faxed require labs to [facility] with their letter of request.

## 2020-03-26 ENCOUNTER — Telehealth: Payer: Self-pay | Admitting: *Deleted

## 2020-03-26 NOTE — Telephone Encounter (Signed)
Spoke to patient.  Informed patient monitor results are not available for virtual visit.  reschedule for 03/31/20 at 8:20 am .  patient voiced understanding and agreement.

## 2020-03-27 ENCOUNTER — Telehealth: Payer: Managed Care, Other (non HMO) | Admitting: Cardiology

## 2020-03-27 ENCOUNTER — Other Ambulatory Visit: Payer: Self-pay | Admitting: Podiatry

## 2020-03-31 ENCOUNTER — Telehealth: Payer: Self-pay | Admitting: *Deleted

## 2020-03-31 ENCOUNTER — Telehealth (INDEPENDENT_AMBULATORY_CARE_PROVIDER_SITE_OTHER): Payer: Managed Care, Other (non HMO) | Admitting: Cardiology

## 2020-03-31 ENCOUNTER — Encounter: Payer: Self-pay | Admitting: Cardiology

## 2020-03-31 VITALS — BP 134/82 | HR 70 | Ht 66.0 in | Wt 213.0 lb

## 2020-03-31 DIAGNOSIS — R002 Palpitations: Secondary | ICD-10-CM | POA: Diagnosis not present

## 2020-03-31 NOTE — Progress Notes (Signed)
Virtual Visit via Telephone Note   This visit type was conducted due to national recommendations for restrictions regarding the COVID-19 Pandemic (e.g. social distancing) in an effort to limit this patient's exposure and mitigate transmission in our community.  Due to her co-morbid illnesses, this patient is at least at moderate risk for complications without adequate follow up.  This format is felt to be most appropriate for this patient at this time.  The patient did not have access to video technology/had technical difficulties with video requiring transitioning to audio format only (telephone).  All issues noted in this document were discussed and addressed.  No physical exam could be performed with this format.  Please refer to the patient's chart for her  consent to telehealth for [facility].   Patient has given verbal permission to conduct this visit via virtual appointment and to bill insurance 03/31/2020 1:15 PM     Evaluation Performed:  Follow-up visit  Date:  03/31/2020   ID:  [name], DOB 04/15/1956, MRN [id number]  Patient Location: Home Provider Location: Home Office  PCP:  Shon Baton, MD  Cardiologist:  Glenetta Hew, MD  Electrophysiologist:  None   Chief Complaint:   Chief Complaint  Patient presents with  . Follow-up    Monitor results  . Palpitations    Short bursts of SVT noted but nothing prolonged.    History of Present Illness:    [name] is a 64 y.o. female with history of PALPITATIONS who presents via audio/video conferencing for a telehealth visit today to discuss monitor results.[name] has a history of hyperthyroidism on Synthroid and hypertension (on losartan) who was seen on September 06, 2019 for evaluation of palpitations.  In the past she has had event monitors in 2016 and November 2020. -> At baseline she is active walking anywhere from 2 to 3 miles most days a week.  No chest discomfort or tightness.  No syncope or  near syncope.  Telemed visit with Jory Sims, DNP - recurrent palpitations -- Event monitor ordered.  Hospitalizations:  . None   Recent - Interim CV studies:   The following studies were reviewed today: . Event monitor June 2021: Sinus rhythm average rate 72 bpm.  Minimum 46 bpm, maximum 131 bpm.  1 4 beat spell recorded as VT, but likely SVT with a rate of 203 bpm, and 10 additional short bursts of SVT -> fastest was 5 beats at 211 bpm, longest 13 beats at 133 bpm. ->  SVT episodes were noted on patient diary.  Inerval History   [name] is being seen for follow-up after her monitor.  She tells me that she actually is feeling a lot better over the last couple weeks.  She has been trying to avoid triggers such as caffeine and other foods that tend to be what triggered the spells.  For the last 3 weeks he has been doing well with this adjustment.  She has not had any further prolonged episodes like the 30-minute spells that she had prior to her initial visit.  She is definitely working on trying to adjust her diet to lose weight and is hoping that in doing so, this will also help avoid triggers.  She is otherwise doing really well.  She is happy to hear that there were actually symptoms associated with a true finding this time.  She felt like she may have been feeling PACs or PVCs off and on here and  there but may actually been feeling the short bursts of 3-4 beats SVT.  Not associated with any chest discomfort or dizziness.  Cardiovascular ROS: no chest pain or dyspnea on exertion positive for - irregular heartbeat and palpitations negative for - edema, orthopnea, paroxysmal nocturnal dyspnea, rapid heart rate, shortness of breath or Syncope/near syncope, TIA/amaurosis fugax, claudication  ROS:  Please see the history of present illness.    The patient does not have symptoms concerning for COVID-19 infection (fever, chills, cough, or new shortness of breath).  Review of Systems    Constitutional: Negative for malaise/fatigue and weight loss.  HENT: Negative for nosebleeds.   Respiratory: Negative for shortness of breath.   Gastrointestinal: Negative for blood in stool and melena.  Genitourinary: Negative for hematuria.  Neurological: Negative for dizziness, focal weakness and headaches.  Psychiatric/Behavioral: The patient is not nervous/anxious.    --   The patient is practicing social distancing.  Past Medical History:  Diagnosis Date  . GERD (gastroesophageal reflux disease)   . Hypothyroid    Acquired.  Has had radiofrequency ablation   Past Surgical History:  Procedure Laterality Date  . TONSILLECTOMY  1962  . UPPER GASTROINTESTINAL ENDOSCOPY    . WRIST FRACTURE SURGERY  2002   right wrist     Holter monitor December 2016: Relatively normal.  Rare PACs.  No arrhythmias.  August 20, 2019, Event Monitor: Sinus rhythm.  Average rate 65 bpm.  Range from 50 to 140 bpm.  Rare PACs with minimal PVCs.  Symptoms noted with mostly sinus rhythm occasionally with PACs or PVCs.  No arrhythmias (either rapid or, and no pauses.)  June 2008, Lexiscan Myoview: Normal perfusion.  No ischemia or infarction.  EF> 75%.  TTE July 2008: Normal LV size and function.  EF 55-65%.  No RWMA.  Normal valves.  Normal echo.  Current Meds  Medication Sig  . ezetimibe (ZETIA) 10 MG tablet Take 10 mg by mouth daily.  Marland Kitchen losartan-hydrochlorothiazide (HYZAAR) 50-12.5 MG tablet Take 1 tablet by mouth daily.   . pantoprazole (PROTONIX) 40 MG tablet Take 40 mg by mouth daily.  Marland Kitchen SYNTHROID 137 MCG tablet Take 137 mcg by mouth daily.     Allergies:   Macrodantin [nitrofurantoin]   Social History   Tobacco Use  . Smoking status: Never Smoker  . Smokeless tobacco: Never Used  Vaping Use  . Vaping Use: Never used  Substance Use Topics  . Alcohol use: No    Alcohol/week: 0.0 standard drinks  . Drug use: No     Family Hx: The patient's family history includes Breast cancer  (age of onset: 45) in her maternal aunt; Breast cancer (age of onset: 61) in her paternal grandmother; Cancer in her father; High Cholesterol in her brother, father, and sister; Hypertension in her brother and mother; Thyroid disease in her mother and sister. There is no history of Colon cancer.   Labs/Other Tests and Data Reviewed:    EKG:  No ECG reviewed.  Recent Labs: 06/24/2019: BUN 19; Creatinine, Ser 0.98; Hemoglobin 14.7; Platelets 440; Potassium 3.4; Sodium 138   Recent Lipid Panel Lab Results  Component Value Date/Time   CHOL (H) 03/18/2007 01:15 AM    224        ATP III CLASSIFICATION: <200     mg/dL   Desirable;  200-239  mg/dL   Borderline High  >=240    mg/dL   High   TRIG 107 03/18/2007 01:15 AM   HDL 61 03/18/2007  01:15 AM   CHOLHDL 3.7 03/18/2007 01:15 AM   LDLCALC (M)754     03/18/2007 01:15 AM    Wt Readings from Last 3 Encounters:  03/31/20 213 lb (96.6 kg)  02/14/20 210 lb (95.3 kg)  09/06/19 212 lb 6.4 oz (96.3 kg)     Objective:    Vital Signs:  BP 134/82   Pulse 70   Ht 5\' 6"  (1.676 m)   Wt 213 lb (96.6 kg)   BMI 34.38 kg/m   VITAL SIGNS:  reviewed Pleaant female in normal acute distress. A&O x 3.  normal Mood & Affect Non-labored respirations   ASSESSMENT & PLAN:    Problem List Items Addressed This Visit    Palpitations - Primary    Monitor shows short little bursts of SVT.  I do think that the "VT spell" was actually just a fast SVT.  They are very short bursts of anywhere from 3-10 beats.  I think avoiding medications is probably the best option.  If necessary we could consider beta-blocker or calcium channel blocker but, but for now try to avoid triggers is probably the best option.  Discussed vagal maneuvers for breakthrough spells that last longer.         COVID-19 Education: The signs and symptoms of COVID-19 were discussed with the patient and how to seek care for testing (follow up with PCP or arrange E-visit).   The importance  of social distancing was discussed today.  Time:   Today, I have spent 16 minutes with the patient with telehealth technology discussing the above problems.  Additional 5 minutes in charting   Medication Adjustments/Labs and Tests Ordered: Current medicines are reviewed at length with the patient today.  Concerns regarding medicines are outlined above.   Patient Instructions  Medication Instructions:  None *If you need a refill on your cardiac medications before your next appointment, please call your pharmacy*   Lab Work: None    Testing/Procedures:  My recommendations for the home monitor were it would be that if you are having longer spells of these palpitations then hopefully we can capture 1 that lasts more than a few seconds.  But your monitor that you wore showed episodes lasting anywhere from 3 to 10 seconds.   DR Ellyn Hack RECOMMENDS THAT YOU  PURCHASES  " Kardia" By [employer]. FROM THE  GOOGLE/[employer]  APP PLAY STORE or BEST BUY.   THE APP IS FREE , BUT THE  EQUIPMENT HAS A COST. IT IS A WAY FOR YOU TO OBTAIN A RECORDING OF YOUR HEART RATE . IT WILL BE A SHORT RHYTHM  STRIP THAT YOU CAN SHOW TO MEDICAL STAFF.  ALSO , YOU MAY [place] - EKG- MONITOR YOU ARE ABLE TO KEEP RECORDINGS ON YOUR SMARTPHONE , INSTEAD OF PURCHASING A SEPARATE EQUIPMENT   Dr Ellyn Hack  recommends portable  ECG monitor  to capture heart rate reading. There is a monitor that can be purchased on [employer]. The  monitor name is "EMAY"  at last checked it cost @ $79.  Our understanding there no more cost of that purchasing the machine. You will need a computer to be able to upload your recording and to be printed. It also come with a  USB to charge the monitor.  Follow-Up: At [facility], you and your health needs are our priority.  As part of our continuing mission to provide you with exceptional heart care, we have created designated Provider Care Teams.  These Care Teams include  your primary Cardiologist (physician) and Advanced Practice Providers (APPs -  Physician Assistants and Nurse Practitioners) who all work together to provide you with the care you need, when you need it.     Your next appointment:   1 year(s)  The format for your next appointment:   Either In Person or Virtual  Provider:    Glenetta Hew, MD     Other Instructions  Your event monitor did show short little bursts of anywhere from 5-15 beats of premature beats running together.  These are relatively benign findings that we call supraventricular tachycardia (SVT) because is more than 3 beats, but traditionally SVT episodes are longer spells lasting several minutes to hours.  These of the spells that are more likely to cause symptoms and as we discussed doing certain maneuvers to break the spells if they last more than a minute can help you from having bad symptoms.  The short spells lasting 3 to 5 seconds are benign.   Vagal maneuvers Recommendations for vagal maneuvers:  "Bearing down"  Coughing  Gagging  Icy, cold towel on face or drink ice cold water   I like your ideas of trying to avoid food triggers for these symptoms.  Things like caffeine, tea, chocolates and other sweets are notables that can trigger these events.     Signed, Glenetta Hew, MD  03/31/2020 1:15 PM    [facility]

## 2020-03-31 NOTE — Assessment & Plan Note (Signed)
Monitor shows short little bursts of SVT.  I do think that the "VT spell" was actually just a fast SVT.  They are very short bursts of anywhere from 3-10 beats.  I think avoiding medications is probably the best option.  If necessary we could consider beta-blocker or calcium channel blocker but, but for now try to avoid triggers is probably the best option.  Discussed vagal maneuvers for breakthrough spells that last longer.

## 2020-03-31 NOTE — Telephone Encounter (Signed)
1st attempt to call to start virtual visit

## 2020-03-31 NOTE — Telephone Encounter (Signed)
2nd attempt to contact patient -- work and cell number  Left message to start virtual visit

## 2020-03-31 NOTE — Telephone Encounter (Signed)
RN left detailed information  for patient from today's virtual visit 03/31/20 .  AVS SUMMARY has been sent by mychart .   any question may call back

## 2020-03-31 NOTE — Telephone Encounter (Signed)
  Patient Consent for Virtual Visit         [name] has provided verbal consent on 03/31/2020 for a virtual visit (video or telephone).   CONSENT FOR VIRTUAL VISIT FOR:  [name]  By participating in this virtual visit I agree to the following:  I hereby voluntarily request, consent and authorize [facility] and its employed or contracted physicians, physician assistants, nurse practitioners or other licensed health care professionals (the Practitioner), to provide me with telemedicine health care services (the "Services") as deemed necessary by the treating Practitioner. I acknowledge and consent to receive the Services by the Practitioner via telemedicine. I understand that the telemedicine visit will involve communicating with the Practitioner through live audiovisual communication technology and the disclosure of certain medical information by electronic transmission. I acknowledge that I have been given the opportunity to request an in-person assessment or other available alternative prior to the telemedicine visit and am voluntarily participating in the telemedicine visit.  I understand that I have the right to withhold or withdraw my consent to the use of telemedicine in the course of my care at any time, without affecting my right to future care or treatment, and that the Practitioner or I may terminate the telemedicine visit at any time. I understand that I have the right to inspect all information obtained and/or recorded in the course of the telemedicine visit and may receive copies of available information for a reasonable fee.  I understand that some of the potential risks of receiving the Services via telemedicine include:  Marland Kitchen Delay or interruption in medical evaluation due to technological equipment failure or disruption; . Information transmitted may not be sufficient (e.g. poor resolution of images) to allow for appropriate medical decision making by the Practitioner;  and/or  . In rare instances, security protocols could fail, causing a breach of personal health information.  Furthermore, I acknowledge that it is my responsibility to provide information about my medical history, conditions and care that is complete and accurate to the best of my ability. I acknowledge that Practitioner's advice, recommendations, and/or decision may be based on factors not within their control, such as incomplete or inaccurate data provided by me or distortions of diagnostic images or specimens that may result from electronic transmissions. I understand that the practice of medicine is not an exact science and that Practitioner makes no warranties or guarantees regarding treatment outcomes. I acknowledge that a copy of this consent can be made available to me via my patient portal ([facility]), or I can request a printed copy by calling the office of [facility].    I understand that my insurance will be billed for this visit.   I have read or had this consent read to me. . I understand the contents of this consent, which adequately explains the benefits and risks of the Services being provided via telemedicine.  . I have been provided ample opportunity to ask questions regarding this consent and the Services and have had my questions answered to my satisfaction. . I give my informed consent for the services to be provided through the use of telemedicine in my medical care

## 2020-03-31 NOTE — Patient Instructions (Addendum)
Medication Instructions:  None *If you need a refill on your cardiac medications before your next appointment, please call your pharmacy*   Lab Work: None    Testing/Procedures:  My recommendations for the home monitor were it would be that if you are having longer spells of these palpitations then hopefully we can capture 1 that lasts more than a few seconds.  But your monitor that you wore showed episodes lasting anywhere from 3 to 10 seconds.   DR Ellyn Hack RECOMMENDS THAT YOU  PURCHASES  " Kardia" By [employer]. FROM THE  GOOGLE[employer]  APP PLAY STORE or BEST BUY.   THE APP IS FREE , BUT THE  EQUIPMENT HAS A COST. IT IS A WAY FOR YOU TO OBTAIN A RECORDING OF YOUR HEART RATE . IT WILL BE A SHORT RHYTHM  STRIP THAT YOU CAN SHOW TO MEDICAL STAFF.  ALSO , YOU MAY [facility] - EKG- MONITOR YOU ARE ABLE TO KEEP RECORDINGS ON YOUR SMARTPHONE , INSTEAD OF PURCHASING A SEPARATE EQUIPMENT   Dr Ellyn Hack  recommends portable  ECG monitor  to capture heart rate reading. There is a monitor that can be purchased on [employer]. The  monitor name is "EMAY"  at last checked it cost @ $79.  Our understanding there no more cost of that purchasing the machine. You will need a computer to be able to upload your recording and to be printed. It also come with a  USB to charge the monitor.  Follow-Up: At [facility], you and your health needs are our priority.  As part of our continuing mission to provide you with exceptional heart care, we have created designated Provider Care Teams.  These Care Teams include your primary Cardiologist (physician) and Advanced Practice Providers (APPs -  Physician Assistants and Nurse Practitioners) who all work together to provide you with the care you need, when you need it.     Your next appointment:   1 year(s)  The format for your next appointment:   Either In Person or Virtual  Provider:    Glenetta Hew, MD     Other Instructions  Your  event monitor did show short little bursts of anywhere from 5-15 beats of premature beats running together.  These are relatively benign findings that we call supraventricular tachycardia (SVT) because is more than 3 beats, but traditionally SVT episodes are longer spells lasting several minutes to hours.  These of the spells that are more likely to cause symptoms and as we discussed doing certain maneuvers to break the spells if they last more than a minute can help you from having bad symptoms.  The short spells lasting 3 to 5 seconds are benign.   Vagal maneuvers Recommendations for vagal maneuvers:  "Bearing down"  Coughing  Gagging  Icy, cold towel on face or drink ice cold water   I like your ideas of trying to avoid food triggers for these symptoms.  Things like caffeine, tea, chocolates and other sweets are notables that can trigger these events.

## 2020-05-01 ENCOUNTER — Other Ambulatory Visit: Payer: Self-pay | Admitting: Podiatry

## 2020-05-16 ENCOUNTER — Other Ambulatory Visit: Payer: Self-pay | Admitting: Podiatry

## 2020-11-27 ENCOUNTER — Ambulatory Visit: Payer: Managed Care, Other (non HMO) | Admitting: Podiatry

## 2021-03-26 ENCOUNTER — Emergency Department (HOSPITAL_COMMUNITY)
Admission: EM | Admit: 2021-03-26 | Discharge: 2021-03-27 | Disposition: A | Discharge status: Home / Self Care | Payer: Managed Care, Other (non HMO) | Attending: Emergency Medicine | Admitting: Emergency Medicine

## 2021-03-26 ENCOUNTER — Encounter (HOSPITAL_COMMUNITY): Payer: Self-pay | Admitting: Emergency Medicine

## 2021-03-26 ENCOUNTER — Emergency Department (HOSPITAL_COMMUNITY): Payer: Managed Care, Other (non HMO)

## 2021-03-26 DIAGNOSIS — R42 Dizziness and giddiness: Secondary | ICD-10-CM | POA: Insufficient documentation

## 2021-03-26 DIAGNOSIS — E039 Hypothyroidism, unspecified: Secondary | ICD-10-CM | POA: Insufficient documentation

## 2021-03-26 DIAGNOSIS — R002 Palpitations: Secondary | ICD-10-CM | POA: Insufficient documentation

## 2021-03-26 DIAGNOSIS — Z79899 Other long term (current) drug therapy: Secondary | ICD-10-CM | POA: Diagnosis not present

## 2021-03-26 LAB — CBC WITH DIFFERENTIAL/PLATELET
Abs Immature Granulocytes: 0.08 10*3/uL — ABNORMAL HIGH (ref 0.00–0.07)
Basophils Absolute: 0.1 10*3/uL (ref 0.0–0.1)
Basophils Relative: 2 %
Eosinophils Absolute: 0.2 10*3/uL (ref 0.0–0.5)
Eosinophils Relative: 3 %
HCT: 40 % (ref 36.0–46.0)
Hemoglobin: 13.3 g/dL (ref 12.0–15.0)
Immature Granulocytes: 1 %
Lymphocytes Relative: 30 %
Lymphs Abs: 2.7 10*3/uL (ref 0.7–4.0)
MCH: 31.1 pg (ref 26.0–34.0)
MCHC: 33.3 g/dL (ref 30.0–36.0)
MCV: 93.7 fL (ref 80.0–100.0)
Monocytes Absolute: 0.8 10*3/uL (ref 0.1–1.0)
Monocytes Relative: 9 %
Neutro Abs: 4.9 10*3/uL (ref 1.7–7.7)
Neutrophils Relative %: 55 %
Platelets: 572 10*3/uL — ABNORMAL HIGH (ref 150–400)
RBC: 4.27 MIL/uL (ref 3.87–5.11)
RDW: 12.5 % (ref 11.5–15.5)
WBC: 8.8 10*3/uL (ref 4.0–10.5)
nRBC: 0 % (ref 0.0–0.2)

## 2021-03-26 LAB — BASIC METABOLIC PANEL WITH GFR
Anion gap: 9 (ref 5–15)
BUN: 17 mg/dL (ref 8–23)
CO2: 24 mmol/L (ref 22–32)
Calcium: 9.1 mg/dL (ref 8.9–10.3)
Chloride: 104 mmol/L (ref 98–111)
Creatinine, Ser: 0.96 mg/dL (ref 0.44–1.00)
GFR, Estimated: >60 mL/min
Glucose, Bld: 131 mg/dL — ABNORMAL HIGH (ref 70–99)
Potassium: 3.5 mmol/L (ref 3.5–5.1)
Sodium: 137 mmol/L (ref 135–145)

## 2021-03-26 LAB — TROPONIN I (HIGH SENSITIVITY): Troponin I (High Sensitivity): 4 ng/L

## 2021-03-26 NOTE — ED Provider Notes (Signed)
Emergency Medicine Provider Triage Evaluation Note  [name] , a 65 y.o. female  was evaluated in triage.  Pt complains of increased heart rate.  She thought it was due to SVT which she has a history of but is able to get herself out of it.  Today this episode lasted for about 45 minutes.  She states that the palpitations and dizziness has improved but she just feels "tired now."  Denies any chest pain, shortness of breath.  Initially felt like she wanted to pass out.  Review of Systems  Positive: Fatigue Negative: Chest pain, shortness of breath  Physical Exam  BP (!) 166/98 (BP Location: Right Arm)   Pulse 98   Temp 98.4 F (36.9 C) (Oral)   Resp 18   SpO2 99%  Gen:   Awake, no distress   Resp:  Normal effort  MSK:   Moves extremities without difficulty  Other:  No respiratory distress  Medical Decision Making  Medically screening exam initiated at 8:49 PM.  Appropriate orders placed.  [name] was informed that the remainder of the evaluation will be completed by another provider, this initial triage assessment does not replace that evaluation, and the importance of remaining in the ED until their evaluation is complete.  Lab work and EKG ordered   Delia Heady, PA-C 03/26/21 2050    Arnaldo Natal, MD 03/26/21 [phone]

## 2021-03-26 NOTE — ED Triage Notes (Signed)
Pt arrive POV from home for increase HR and dizziness, pt states she felt like she was going to pass out. Pt is AO x 4 during triage, nad noticed, no SOB or pain.

## 2021-03-27 LAB — TROPONIN I (HIGH SENSITIVITY): Troponin I (High Sensitivity): 4 ng/L (ref <18)

## 2021-03-27 MED ORDER — METOPROLOL TARTRATE 25 MG PO TABS: 25.0000 mg | ORAL_TABLET | Freq: Every day | ORAL | 0 refills | Status: DC | PRN

## 2021-03-27 NOTE — Discharge Instructions (Addendum)
You likely had SVT  You have SVT you can try vagal maneuvers.  If your symptoms last longer than 30 minutes, you can take metoprolol.   See your cardiologist. Consider ablation if you have frequent runs of SVT  Return to ER if you have palpitation more than an hour, chest pain, shortness of breath

## 2021-03-27 NOTE — ED Provider Notes (Signed)
[facility] EMERGENCY DEPARTMENT Provider Note   CSN: [id number] Arrival date & time: 03/26/21  2022     History Chief Complaint  Patient presents with  . Palpitations  . Dizziness    [name] is a 65 y.o. female hx of GERD, here with palpitations and dizziness.  Patient states that around 8 PM, she has acute onset of palpitations.  She states that she has a history of SVT and it usually lasts less than 10 minutes.  She states that this time it lasted about 45 minutes.  She tried vagal maneuvers but was not successful.  By the time she got to the ER, it has resolved.  Denies any chest pain or shortness of breath.  She states that she just felt dizzy at that time.  She has seen cardiology for this and had a Holter monitor before that showed short runs of SVT.  She is not currently on any medicines for SVT.  She also has not been drinking any caffeine.  The history is provided by the patient.      Past Medical History:  Diagnosis Date  . GERD (gastroesophageal reflux disease)   . Hypothyroid    Acquired.  Has had radiofrequency ablation    Patient Active Problem List   Diagnosis Date Noted  . Mucoid cyst of joint 11/20/2015  . Palpitations 09/04/2015  . HYPOTHYROIDISM 12/21/2007  . HYPERLIPIDEMIA 12/21/2007  . RHINOSINUSITIS, ALLERGIC 12/21/2007  . GERD 12/21/2007    Past Surgical History:  Procedure Laterality Date  . TONSILLECTOMY  1962  . UPPER GASTROINTESTINAL ENDOSCOPY    . WRIST FRACTURE SURGERY  2002   right wrist     OB History   No obstetric history on file.     Family History  Problem Relation Age of Onset  . Thyroid disease Mother   . Hypertension Mother   . Thyroid disease Sister   . High Cholesterol Sister   . Cancer Father   . High Cholesterol Father   . Hypertension Brother   . High Cholesterol Brother   . Breast cancer Maternal Aunt 42  . Breast cancer Paternal Grandmother 72  . Colon cancer Neg Hx     Social  History   Tobacco Use  . Smoking status: Never  . Smokeless tobacco: Never  Vaping Use  . Vaping Use: Never used  Substance Use Topics  . Alcohol use: No    Alcohol/week: 0.0 standard drinks  . Drug use: No    Home Medications Prior to Admission medications   Medication Sig Start Date End Date Taking? Authorizing Provider  colchicine 0.6 MG tablet TAKE 1 TABLET (0.6 MG TOTAL) BY MOUTH 2 (TWO) TIMES DAILY. 05/16/20   [name], DPM  ezetimibe (ZETIA) 10 MG tablet Take 10 mg by mouth daily. 01/21/20   [provider]  losartan-hydrochlorothiazide (HYZAAR) 50-12.5 MG tablet Take 1 tablet by mouth daily.  03/15/16   [provider]  pantoprazole (PROTONIX) 40 MG tablet Take 40 mg by mouth daily. 02/13/20   [provider]  SYNTHROID 137 MCG tablet Take 137 mcg by mouth daily. 01/08/20   [provider]    Allergies    Macrodantin [nitrofurantoin]  Review of Systems   Review of Systems  Cardiovascular:  Positive for palpitations.  Neurological:  Positive for dizziness.  All other systems reviewed and are negative.  Physical Exam Updated Vital Signs BP (!) 149/98 (BP Location: Right Arm)   Pulse 76  Temp 98.1 F (36.7 C) (Oral)   Resp 20   SpO2 100%   Physical Exam Vitals and nursing note reviewed.  Constitutional:      Appearance: Normal appearance.  HENT:     Head: Normocephalic.     Nose: Nose normal.     Mouth/Throat:     Mouth: Mucous membranes are moist.  Eyes:     Extraocular Movements: Extraocular movements intact.     Pupils: Pupils are equal, round, and reactive to light.  Cardiovascular:     Rate and Rhythm: Normal rate and regular rhythm.     Pulses: Normal pulses.     Heart sounds: Normal heart sounds.  Pulmonary:     Effort: Pulmonary effort is normal.     Breath sounds: Normal breath sounds.  Abdominal:     General: Abdomen is flat.     Palpations: Abdomen is soft.  Musculoskeletal:        General: Normal  range of motion.     Cervical back: Normal range of motion and neck supple.  Skin:    General: Skin is warm.     Capillary Refill: Capillary refill takes less than 2 seconds.  Neurological:     General: No focal deficit present.     Mental Status: She is alert and oriented to person, place, and time.  Psychiatric:        Mood and Affect: Mood normal.        Behavior: Behavior normal.    ED Results / Procedures / Treatments   Labs (all labs ordered are listed, but only abnormal results are displayed) Labs Reviewed  BASIC METABOLIC PANEL - Abnormal; Notable for the following components:      Result Value   Glucose, Bld 131 (*)    All other components within normal limits  CBC WITH DIFFERENTIAL/PLATELET - Abnormal; Notable for the following components:   Platelets 572 (*)    Abs Immature Granulocytes 0.08 (*)    All other components within normal limits  CBG MONITORING, ED  TROPONIN I (HIGH SENSITIVITY)  TROPONIN I (HIGH SENSITIVITY)    EKG EKG Interpretation  Date/Time:  Thursday March 26 2021 20:39:57 EDT Ventricular Rate:  93 PR Interval:  136 QRS Duration: 76 QT Interval:  348 QTC Calculation: 432 R Axis:   77 Text Interpretation: Normal sinus rhythm Normal ECG No significant change since last tracing Confirmed by Wandra Arthurs [phone]) on 03/27/2021 2:55:42 AM  Radiology DG Chest 2 View  Result Date: 03/26/2021 CLINICAL DATA:  Fatigue and palpitations. EXAM: CHEST - 2 VIEW COMPARISON:  06/24/2019 FINDINGS: The heart size and mediastinal contours are within normal limits. Both lungs are clear. The visualized skeletal structures are unremarkable. IMPRESSION: No active cardiopulmonary disease. Electronically Signed   By: Lucienne Capers M.D.   On: 03/26/2021 21:29    Procedures Procedures   Medications Ordered in ED Medications - No data to display  ED Course  I have reviewed the triage vital signs and the nursing notes.  Pertinent labs & imaging results that were  available during my care of the patient were reviewed by me and considered in my medical decision making (see chart for details).    MDM Rules/Calculators/A&P                         [name] is a 65 y.o. female who presented with palpitation.  Patient has a history of SVT and had a Holter  monitor performed.  I suspect that she had SVT at this time.  She spontaneously converted when she arrived to the ER and initial EKG showed sinus rhythm. Her electrolytes and troponin x2 were negative.  I told her that she can try vagal maneuvers next time and if she has episodes lasting more than 30 minutes she can take small dose of metoprolol.  She has cardiology follow-up in August and I suggest that she consider EP referral   Final Clinical Impression(s) / ED Diagnoses Final diagnoses:  None    Rx / DC Orders ED Discharge Orders     None        Drenda Freeze, MD 03/27/21 [phone]

## 2021-04-01 ENCOUNTER — Telehealth: Payer: Self-pay | Admitting: Cardiology

## 2021-04-01 NOTE — Telephone Encounter (Signed)
Spoke with patient of Dr. Ellyn Hack - she reports recent episodes of palpitations She went to ED on 7/7 -- started on lopressor PRN but just picked up today and took a dose which helped symptoms She had an episode this morning that lasted about 18mins and another around 12 noon that lasted about 40mins She reports dizziness, "doesn't feel right in my head". Denies chest pain.   The episodes have come about after eating, but cannot be correlated to any particular foods. She has had no caffeine intake for years  Scheduled her for MD OV on 7/20 @ 0900  Educated her on use of metoprolol tartrate and to monitor BP, pulse and keep log of these readings along with symptoms

## 2021-04-01 NOTE — Telephone Encounter (Signed)
Patient c/o Palpitations:  High priority if patient c/o lightheadedness, shortness of breath, or chest pain  How long have you had palpitations/irregular HR/ Afib? Are you having the symptoms now? Started 07/07, not having now but had two occurrences of it today one at 08:30-9:30 AM and another around 12:45 PM   Are you currently experiencing lightheadedness, SOB or CP? Dizzy and very tired  Do you have a history of afib (atrial fibrillation) or irregular heart rhythm? No   Have you checked your BP or HR? (document readings if available):  HR Now 78 BP Now 124/80   Are you experiencing any other symptoms? Dizziness & excessive fatigue

## 2021-04-08 ENCOUNTER — Encounter: Payer: Self-pay | Admitting: Cardiology

## 2021-04-08 ENCOUNTER — Ambulatory Visit (INDEPENDENT_AMBULATORY_CARE_PROVIDER_SITE_OTHER): Payer: Managed Care, Other (non HMO) | Admitting: Cardiology

## 2021-04-08 ENCOUNTER — Other Ambulatory Visit: Payer: Self-pay

## 2021-04-08 VITALS — BP 138/86 | HR 80 | Ht 66.0 in | Wt 205.4 lb

## 2021-04-08 DIAGNOSIS — R002 Palpitations: Secondary | ICD-10-CM | POA: Diagnosis not present

## 2021-04-08 DIAGNOSIS — I479 Paroxysmal tachycardia, unspecified: Secondary | ICD-10-CM | POA: Diagnosis not present

## 2021-04-08 NOTE — Patient Instructions (Addendum)
Medication Instructions:  No changes   You may use your Metoprolol as needed for your palpitations -  if use 5 dose  ( twice a day )or more with the same episodes - then you need to wean off the Metoprolol by taking 1/2 tablet for 5 more doses the stop .  *If you need a refill on your cardiac medications before your next appointment, please call your pharmacy*  Other Instructions    To assist you with treatment for your episodes Try to  relax /calming technique  Vagal maneuvers-Recommendations for vagal maneuvers: "Bearing down" Coughing Gagging Icy, cold towel on face or drink ice cold water    Lab Work: Not needed   Testing/Procedures: Not needed   Follow-Up: At [facility], you and your health needs are our priority.  As part of our continuing mission to provide you with exceptional heart care, we have created designated Provider Care Teams.  These Care Teams include your primary Cardiologist (physician) and Advanced Practice Providers (APPs -  Physician Assistants and Nurse Practitioners) who all work together to provide you with the care you need, when you need it.     Your next appointment:   12 month(s)  The format for your next appointment:   In Person  Provider:   Glenetta Hew, MD

## 2021-04-08 NOTE — Progress Notes (Signed)
Primary Care Provider: Shon Baton, MD Cardiologist: Glenetta Hew, MD Electrophysiologist: None  Clinic Note: Chief Complaint  Patient presents with   Hospitalization Follow-up    ER visit after episode of what sounded like SVT    ===================================  ASSESSMENT/PLAN   Problem List Items Addressed This Visit     Palpitations    She had short little bursts of SVT/PAT last year on her monitor.  It is possible that she had a longer run of SVT although the monitor episodes are very short-lasting less than 10 beats.  Now that she has had a prolonged spell, not unreasonable to use a beta-blocker, but I do not know that she needs to be on it as a standing medication.  Agree with as needed use of metoprolol for now.  If she has to use it a lot in the next year or so, would potentially consider converting to long-acting Toprol for better tolerance.       Relevant Orders   EKG 12-Lead (Completed)   Paroxysmal tachycardia ([name]) - Primary    She did quite likely have an episode of SVT, or simply could have been sinus tachycardia.  Difficult to really assess because we did not have a monitor showing it.  I talked with her in length about the fact that these arrhythmias are benign as long she denies any syncope or near syncope.  We discussed vagal maneuvers.  I did comment on using PRN metoprolol.  I think taking it for couple days after an episode is reasonable.  She would then wean herself off.  We discussed that-noted in patient instructions.  Discussed importance of staying adequately hydrated and avoiding triggers such as caffeine, stress, poor sleep, sweets, alcohol       Relevant Orders   EKG 12-Lead (Completed)    ===================================  HPI:    [name] is a 65 y.o. female with a PMH of short bursts of PAT/PSVT noted on monitor (nothing prolonged on monitor) most berry sounds like he is try to be below who presents today for annual  follow-up but also ER follow-up for palpitations.  [name] was last seen on March 31, 2020 via telemedicine in follow-up from her event monitor which showed likely 11 short bursts of 4-13 beats of PAT/SVT that were not symptomatic.  Fastest heart rate for only 5 beats was 210 beats longest was 13 at a rate of 133 bpm.  She was doing well was try to avoid triggers.  Had not had any significant episodes.  Recommended avoiding medications.  Plan was to avoid triggers, stay adequately hydrated.  Recent Hospitalizations:  ER visit 03/26/2021: She noted acute onset of palpitations lasting maybe 45 minutes.  Unsuccessful vagal maneuvers.  By the time she reached the ER they resolved.  She was trying to minimize caffeine.  Told by the ER physician that she probably had an SVT run and that she should be on medications.  She ruled out for MI and was discharged home.  He suggested EP referral.    Reviewed  CV studies:    The following studies were reviewed today: (if available, images/films reviewed: From Epic Chart or Care Everywhere) No new studies:   Interval History:   [name] returns here today in response to her recent ER visit.  She has not had any further episodes of rapid heart rates or palpitations.  Thankfully, when she have the fast heart rate [place], she did not really have any  syncope or near syncope but just felt little bit lightheaded.  She was very concerned because the ER doctor mentioned that she should be seen by EP.  This is the first breakthrough spell she has had since I last saw her.  She says that every now and then she has palpitations but relatively infrequently.  She says that she was started on metoprolol 25 mg tablets as needed and has not had to use any since her ER visit.  [name] otherwise regarding standpoint she is doing very well.  Quite stable.  She was recently diagnosed with gout, that was causing some of the pain that may have driven her fast heart rate.   She is now doing better now walking usually she walks around [employer].  In fact her episode of tachycardia occurred while she was at [employer] --> she had a sustained heart rate of 133 bpm.  CV Review of Symptoms (Summary) Cardiovascular ROS: positive for - rapid heart rate and this was associated little bit with dizziness and lightheadedness as well as some dyspnea.  But mostly she got very anxious. negative for - chest pain, dyspnea on exertion, edema, irregular heartbeat, orthopnea, paroxysmal nocturnal dyspnea, shortness of breath, or syncope/near syncope or TIA/amaurosis fugax, claudication  REVIEWED OF SYSTEMS   Review of Systems  Constitutional:  Negative for malaise/fatigue and weight loss.  HENT:  Negative for nosebleeds.   Respiratory: Negative.  Negative for shortness of breath.   Cardiovascular:        Per HPI  Gastrointestinal:  Negative for blood in stool and melena.  Genitourinary:  Negative for hematuria.  Musculoskeletal:  Positive for joint pain (Was having some gout in her foot).  Neurological:  Positive for dizziness (When she was tachycardic, but otherwise not). Negative for focal weakness and weakness.  Psychiatric/Behavioral:  The patient is nervous/anxious.    I have reviewed and (if needed) personally updated the patient's problem list, medications, allergies, past medical and surgical history, social and family history.   PAST MEDICAL HISTORY   Past Medical History:  Diagnosis Date   GERD (gastroesophageal reflux disease)    Hypothyroid    Acquired.  Has had radiofrequency ablation    PAST SURGICAL HISTORY   Past Surgical History:  Procedure Laterality Date   TONSILLECTOMY  1962   UPPER GASTROINTESTINAL ENDOSCOPY     WRIST FRACTURE SURGERY  2002   right wrist     There is no immunization history on file for this patient.  MEDICATIONS/ALLERGIES   Current Meds  Medication Sig   losartan-hydrochlorothiazide (HYZAAR) 50-12.5 MG tablet Take 1 tablet by  mouth daily.    metoprolol tartrate (LOPRESSOR) 25 MG tablet Take 1 tablet (25 mg total) by mouth daily as needed.   pantoprazole (PROTONIX) 40 MG tablet Take 40 mg by mouth daily.   SYNTHROID 137 MCG tablet Take 137 mcg by mouth daily.    Allergies  Allergen Reactions   Macrodantin [Nitrofurantoin] Hives    Shortness of breath    SOCIAL HISTORY/FAMILY HISTORY   Reviewed in Epic:  Pertinent findings:  Social History   Tobacco Use   Smoking status: Never   Smokeless tobacco: Never  Vaping Use   Vaping Use: Never used  Substance Use Topics   Alcohol use: No    Alcohol/week: 0.0 standard drinks   Drug use: No   Social History   Social History Narrative   Married with 1 child.  No grandchildren.  Lives with her spouse.   She is  quite active.  Quit smoking in 2003.   Walks 3 miles a day 7 days a week for about an hour 50 minutes a day    OBJCTIVE -PE, EKG, labs   Wt Readings from Last 3 Encounters:  04/08/21 205 lb 6.4 oz (93.2 kg)  03/31/20 213 lb (96.6 kg)  02/14/20 210 lb (95.3 kg)    Physical Exam: BP 138/86   Pulse 80   Ht 5\' 6"  (1.676 m)   Wt 205 lb 6.4 oz (93.2 kg)   SpO2 98%   BMI 33.15 kg/m  Physical Exam Vitals reviewed.  Constitutional:      General: She is not in acute distress.    Appearance: Normal appearance. She is obese. She is not toxic-appearing.  HENT:     Head: Normocephalic and atraumatic.  Neck:     Vascular: No carotid bruit or JVD.  Cardiovascular:     Rate and Rhythm: Normal rate and regular rhythm. No extrasystoles are present.    Chest Wall: PMI is not displaced.     Pulses: Normal pulses.     Heart sounds: Normal heart sounds, S1 normal and S2 normal. No murmur heard.   No friction rub. No gallop.  Pulmonary:     Effort: Pulmonary effort is normal. No respiratory distress.     Breath sounds: Normal breath sounds. No wheezing, rhonchi or rales.  Musculoskeletal:        General: No swelling. Normal range of motion.      Cervical back: Normal range of motion and neck supple.  Skin:    General: Skin is warm and dry.     Coloration: Skin is not pale.  Neurological:     General: No focal deficit present.     Mental Status: She is alert and oriented to person, place, and time. Mental status is at baseline.  Psychiatric:        Mood and Affect: Mood normal.        Behavior: Behavior normal.        Thought Content: Thought content normal.        Judgment: Judgment normal.     Comments: Notably less anxious after we started talking     Adult ECG Report  Rate: 80 ;  Rhythm: normal sinus rhythm and borderline left atrial abnormality.  Otherwise normal axis, intervals and durations. ;   Narrative Interpretation: Normal  Recent Labs: Reviewed 02/03/2021: TC 289, TG 240, HDL 53, LDL 188.  A1c 5.5.  Lab Results  Component Value Date   CREATININE 0.96 03/26/2021   BUN 17 03/26/2021   NA 137 03/26/2021   K 3.5 03/26/2021   CL 104 03/26/2021   CO2 24 03/26/2021   CBC Latest Ref Rng & Units 03/26/2021 06/24/2019 10/03/2018  WBC 4.0 - 10.5 K/uL 8.8 10.1 8.9  Hemoglobin 12.0 - 15.0 g/dL 13.3 14.7 14.6  Hematocrit 36.0 - 46.0 % 40.0 44.3 42.6  Platelets 150 - 400 K/uL 572(H) 440(H) 453(H)    Lab Results  Component Value Date   TSH 3.722 10/04/2013    ==================================================  COVID-19 Education: The signs and symptoms of COVID-19 were discussed with the patient and how to seek care for testing (follow up with PCP or arrange E-visit).    I spent a total of 29 minutes with the patient spent in direct patient consultation.  Additional time spent with chart review  / charting (studies, outside notes, etc): 14 min Total Time: 43 min  Current medicines are reviewed  at length with the patient today.  (+/- concerns) none  This visit occurred during the SARS-CoV-2 public health emergency.  Safety protocols were in place, including screening questions prior to the visit, additional usage  of staff PPE, and extensive cleaning of exam room while observing appropriate contact time as indicated for disinfecting solutions.  Notice: This dictation was prepared with Dragon dictation along with smaller phrase technology. Any transcriptional errors that result from this process are unintentional and may not be corrected upon review.  Patient Instructions / Medication Changes & Studies & Tests Ordered   Patient Instructions  Medication Instructions:  No changes   You may use your Metoprolol as needed for your palpitations -  if use 5 dose  ( twice a day )or more with the same episodes - then you need to wean off the Metoprolol by taking 1/2 tablet for 5 more doses the stop .  *If you need a refill on your cardiac medications before your next appointment, please call your pharmacy*  Other Instructions    To assist you with treatment for your episodes Try to  relax /calming technique  Vagal maneuvers-Recommendations for vagal maneuvers: "Bearing down" Coughing Gagging Icy, cold towel on face or drink ice cold water    Lab Work: Not needed   Testing/Procedures: Not needed   Follow-Up: At [facility], you and your health needs are our priority.  As part of our continuing mission to provide you with exceptional heart care, we have created designated Provider Care Teams.  These Care Teams include your primary Cardiologist (physician) and Advanced Practice Providers (APPs -  Physician Assistants and Nurse Practitioners) who all work together to provide you with the care you need, when you need it.     Your next appointment:   12 month(s)  The format for your next appointment:   In Person  Provider:   Glenetta Hew, MD     Studies Ordered:   Orders Placed This Encounter  Procedures   EKG 12-Lead      Glenetta Hew, M.D., M.S. Interventional Cardiologist   Pager # [phone] Phone # [phone] [address] [place], [place]  [id number]   Thank you for choosing Heartcare at [facility]!!

## 2021-04-18 ENCOUNTER — Encounter: Payer: Self-pay | Admitting: Cardiology

## 2021-04-18 NOTE — Assessment & Plan Note (Signed)
She did quite likely have an episode of SVT, or simply could have been sinus tachycardia.  Difficult to really assess because we did not have a monitor showing it.  I talked with her in length about the fact that these arrhythmias are benign as long she denies any syncope or near syncope.  We discussed vagal maneuvers.  I did comment on using PRN metoprolol.  I think taking it for couple days after an episode is reasonable.  She would then wean herself off.  We discussed that-noted in patient instructions.  Discussed importance of staying adequately hydrated and avoiding triggers such as caffeine, stress, poor sleep, sweets, alcohol

## 2021-04-18 NOTE — Assessment & Plan Note (Signed)
She had short little bursts of SVT/PAT last year on her monitor.  It is possible that she had a longer run of SVT although the monitor episodes are very short-lasting less than 10 beats.  Now that she has had a prolonged spell, not unreasonable to use a beta-blocker, but I do not know that she needs to be on it as a standing medication.  Agree with as needed use of metoprolol for now.  If she has to use it a lot in the next year or so, would potentially consider converting to long-acting Toprol for better tolerance.

## 2021-06-16 ENCOUNTER — Ambulatory Visit: Payer: Managed Care, Other (non HMO) | Admitting: Cardiology

## 2022-02-03 ENCOUNTER — Other Ambulatory Visit: Payer: Self-pay | Admitting: Neurosurgery

## 2022-02-03 DIAGNOSIS — D329 Benign neoplasm of meninges, unspecified: Secondary | ICD-10-CM

## 2022-03-02 ENCOUNTER — Other Ambulatory Visit: Payer: Managed Care, Other (non HMO)

## 2022-03-19 ENCOUNTER — Emergency Department (HOSPITAL_COMMUNITY)
Admission: EM | Admit: 2022-03-19 | Discharge: 2022-03-19 | Disposition: A | Discharge status: Home / Self Care | Payer: Managed Care, Other (non HMO) | Attending: Emergency Medicine | Admitting: Emergency Medicine

## 2022-03-19 ENCOUNTER — Other Ambulatory Visit: Payer: Self-pay

## 2022-03-19 ENCOUNTER — Emergency Department (HOSPITAL_COMMUNITY): Payer: Managed Care, Other (non HMO)

## 2022-03-19 ENCOUNTER — Encounter (HOSPITAL_COMMUNITY): Payer: Self-pay | Admitting: Emergency Medicine

## 2022-03-19 DIAGNOSIS — D72829 Elevated white blood cell count, unspecified: Secondary | ICD-10-CM | POA: Diagnosis not present

## 2022-03-19 DIAGNOSIS — R002 Palpitations: Secondary | ICD-10-CM | POA: Diagnosis present

## 2022-03-19 LAB — URINALYSIS, ROUTINE W REFLEX MICROSCOPIC
Bilirubin Urine: NEGATIVE
Glucose, UA: NEGATIVE mg/dL
Hgb urine dipstick: NEGATIVE
Ketones, ur: NEGATIVE mg/dL
Nitrite: NEGATIVE
Protein, ur: NEGATIVE mg/dL
Specific Gravity, Urine: 1.003 — ABNORMAL LOW (ref 1.005–1.030)
pH: 6 (ref 5.0–8.0)

## 2022-03-19 LAB — CBC
HCT: 41.4 % (ref 36.0–46.0)
Hemoglobin: 14.1 g/dL (ref 12.0–15.0)
MCH: 31.9 pg (ref 26.0–34.0)
MCHC: 34.1 g/dL (ref 30.0–36.0)
MCV: 93.7 fL (ref 80.0–100.0)
Platelets: 498 10*3/uL — ABNORMAL HIGH (ref 150–400)
RBC: 4.42 MIL/uL (ref 3.87–5.11)
RDW: 12.6 % (ref 11.5–15.5)
WBC: 14.5 10*3/uL — ABNORMAL HIGH (ref 4.0–10.5)
nRBC: 0 % (ref 0.0–0.2)

## 2022-03-19 LAB — BASIC METABOLIC PANEL WITH GFR
Anion gap: 11 (ref 5–15)
BUN: 17 mg/dL (ref 8–23)
CO2: 24 mmol/L (ref 22–32)
Calcium: 10 mg/dL (ref 8.9–10.3)
Chloride: 101 mmol/L (ref 98–111)
Creatinine, Ser: 0.8 mg/dL (ref 0.44–1.00)
GFR, Estimated: >60 mL/min
Glucose, Bld: 98 mg/dL (ref 70–99)
Potassium: 3.7 mmol/L (ref 3.5–5.1)
Sodium: 136 mmol/L (ref 135–145)

## 2022-03-19 LAB — MAGNESIUM: Magnesium: 2.1 mg/dL (ref 1.7–2.4)

## 2022-03-19 LAB — D-DIMER, QUANTITATIVE: D-Dimer, Quant: <0.27 ug/mL-FEU (ref 0.00–0.50)

## 2022-03-19 LAB — TROPONIN I (HIGH SENSITIVITY): Troponin I (High Sensitivity): 4 ng/L (ref <18)

## 2022-03-19 MED ORDER — METOPROLOL SUCCINATE ER 25 MG PO TB24: 25.0000 mg | ORAL_TABLET | Freq: Every day | ORAL | 0 refills | Status: DC

## 2022-03-19 NOTE — ED Provider Triage Note (Signed)
Emergency Medicine Provider Triage Evaluation Note  [name] , a 66 y.o. female  was evaluated in triage.  Pt complains of feeling of dizziness, lightheadedness, some back pain, and frequent PVCs today.  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  She reports that she had 4 PVCs in 30 second period.  She reports she has a cardiologist due to history of thyroid disease, however does not have any cardiac diagnoses.  She denies chest pain or shortness of breath.  She reports that she feels unsteady but the room is not spinning.  She endorses some mild nausea associated with her dizziness. Review of Systems  Positive: Dizziness, lightheadedness, palpitations Negative: Chest pain, shob  Physical Exam  Ht '5\' 6"'$  (1.676 m)   Wt 105 kg   BMI 37.36 kg/m  Gen:   Awake, no distress   Resp:  Normal effort  MSK:   Moves extremities without difficulty  Other:  Normal heart rate and rhythm during my short auscultation  Medical Decision Making  Medically screening exam initiated at 7:35 PM.  Appropriate orders placed.  [name] was informed that the remainder of the evaluation will be completed by another provider, this initial triage assessment does not replace that evaluation, and the importance of remaining in the ED until their evaluation is complete.  Workup initiated   Anselmo Pickler, [place] 03/19/22 1938

## 2022-03-19 NOTE — ED Provider Notes (Signed)
[facility] EMERGENCY DEPARTMENT Provider Note   CSN: [id number] Arrival date & time: 03/19/22  1916     History  Chief Complaint  Patient presents with   Palpitations/PVC    Back Pain     [name] is a 66 y.o. female.  Patient presents chief complaint of palpitations lightheadedness.  Symptoms ongoing since this morning.  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  She states that she can feel each PVC and makes her feel "uncomfortable."  Denies any fevers vomiting cough or diarrhea denies any chest pain.  However she had some vague left upper back "discomfort."  She has had a history of SVTs in the past and when that was the case she had been taking as needed metoprolol which she has not had to take for some time now.       Home Medications Prior to Admission medications   Medication Sig Start Date End Date Taking? Authorizing Provider  metoprolol succinate (TOPROL-XL) 25 MG 24 hr tablet Take 1 tablet (25 mg total) by mouth daily. 03/19/22 04/18/22 Yes Luna Fuse, MD  allopurinol (ZYLOPRIM) 100 MG tablet Take 200 mg by mouth daily. 04/07/21   [provider]  ezetimibe (ZETIA) 10 MG tablet Take 10 mg by mouth daily. Patient not taking: Reported on 04/08/2021 01/21/20   [provider]  losartan-hydrochlorothiazide (HYZAAR) 50-12.5 MG tablet Take 1 tablet by mouth daily.  03/15/16   [provider]  metoprolol tartrate (LOPRESSOR) 25 MG tablet Take 1 tablet (25 mg total) by mouth daily as needed. 03/27/21   Drenda Freeze, MD  pantoprazole (PROTONIX) 40 MG tablet Take 40 mg by mouth daily. 02/13/20   [provider]  SYNTHROID 137 MCG tablet Take 137 mcg by mouth daily. 01/08/20   [provider]      Allergies    Macrodantin [nitrofurantoin]    Review of Systems   Review of Systems  Constitutional:  Negative for fever.  HENT:  Negative for ear pain.   Eyes:   Negative for pain.  Respiratory:  Negative for cough.   Cardiovascular:  Positive for palpitations. Negative for chest pain.  Gastrointestinal:  Negative for abdominal pain.  Genitourinary:  Negative for flank pain.  Musculoskeletal:  Negative for back pain.  Skin:  Negative for rash.  Neurological:  Negative for headaches.    Physical Exam Updated Vital Signs BP 140/78 (BP Location: Right Arm)   Pulse 79   Temp 98.1 F (36.7 C) (Oral)   Resp 18   Ht '5\' 6"'$  (1.676 m)   Wt 105 kg   SpO2 100%   BMI 37.36 kg/m  Physical Exam Constitutional:      General: She is not in acute distress.    Appearance: Normal appearance.  HENT:     Head: Normocephalic.     Nose: Nose normal.  Eyes:     Extraocular Movements: Extraocular movements intact.  Cardiovascular:     Rate and Rhythm: Normal rate.  Pulmonary:     Effort: Pulmonary effort is normal.  Abdominal:     Tenderness: There is no abdominal tenderness. There is no guarding or rebound.  Musculoskeletal:        General: Normal range of motion.     Cervical back: Normal range of motion.     Comments: No C or T or L-spine step-offs or tenderness noted.  Neurological:  General: No focal deficit present.     Mental Status: She is alert and oriented to person, place, and time. Mental status is at baseline.     Cranial Nerves: No cranial nerve deficit.     Motor: No weakness.     Gait: Gait normal.     ED Results / Procedures / Treatments   Labs (all labs ordered are listed, but only abnormal results are displayed) Labs Reviewed  CBC - Abnormal; Notable for the following components:      Result Value   WBC 14.5 (*)    Platelets 498 (*)    All other components within normal limits  URINALYSIS, ROUTINE W REFLEX MICROSCOPIC - Abnormal; Notable for the following components:   Color, Urine STRAW (*)    Specific Gravity, Urine 1.003 (*)    Leukocytes,Ua SMALL (*)    Bacteria, UA RARE (*)    All other components within normal  limits  BASIC METABOLIC PANEL  MAGNESIUM  D-DIMER, QUANTITATIVE  TROPONIN I (HIGH SENSITIVITY)    EKG EKG Interpretation  Date/Time:  Friday March 19 2022 19:42:18 EDT Ventricular Rate:  88 PR Interval:  144 QRS Duration: 80 QT Interval:  360 QTC Calculation: 435 R Axis:   63 Text Interpretation: Sinus rhythm with occasional Premature ventricular complexes Otherwise normal ECG When compared with ECG of 26-Mar-2021 20:39, PREVIOUS ECG IS PRESENT Confirmed by Thamas Jaegers ([id number]) on 03/19/2022 9:00:53 PM  Radiology DG Chest 2 View  Result Date: 03/19/2022 CLINICAL DATA:  Chest pain EXAM: CHEST - 2 VIEW COMPARISON:  03/26/2021 FINDINGS: The heart size and mediastinal contours are within normal limits. Both lungs are clear. The visualized skeletal structures are unremarkable. IMPRESSION: No active cardiopulmonary disease. Electronically Signed   By: Elmer Picker M.D.   On: 03/19/2022 20:24    Procedures Procedures    Medications Ordered in ED Medications - No data to display  ED Course/ Medical Decision Making/ A&P                           Medical Decision Making Amount and/or Complexity of Data Reviewed Labs: ordered.   Review of records shows office visit January 08, 2022 for meningioma.  Cardiac monitoring showing sinus rhythm.  History obtained from family at bedside.  Work-up includes labs chemistry unremarkable CBC shows white count of 14 etiology unclear.  Chest x-ray unremarkable.  Urinalysis shows small leuks but negative nitrites.  0-5 WBCs and rare bacteria.  Patient has no urinary symptoms and elected not to treat this at this time.  D-dimer negative troponin is negative.  Patient given a prescription of metoprolol.  Advise follow-up with cardiology within the week.  Advised immediate return if she has fevers worsening symptoms or any additional concerns.        Final Clinical Impression(s) / ED Diagnoses Final diagnoses:  Palpitations    Rx /  DC Orders ED Discharge Orders          Ordered    metoprolol succinate (TOPROL-XL) 25 MG 24 hr tablet  Daily        03/19/22 2232              Luna Fuse, MD 03/19/22 2233

## 2022-03-19 NOTE — ED Triage Notes (Signed)
Patient reports palpitations today and PVC ( according to iwatch ) with mild lightheadedness , she adds mid back pain this evening . Denies chest pain/respirations unlabored.

## 2022-03-19 NOTE — Discharge Instructions (Signed)
Call your primary care doctor or specialist as discussed in the next 2-3 days.   Return immediately back to the ER if:  Your symptoms worsen within the next 12-24 hours. You develop new symptoms such as new fevers, persistent vomiting, new pain, shortness of breath, or new weakness or numbness, or if you have any other concerns.  

## 2022-03-25 ENCOUNTER — Encounter: Payer: Self-pay | Admitting: Physician Assistant

## 2022-03-25 ENCOUNTER — Ambulatory Visit (INDEPENDENT_AMBULATORY_CARE_PROVIDER_SITE_OTHER): Payer: Managed Care, Other (non HMO) | Admitting: Physician Assistant

## 2022-03-25 VITALS — BP 124/76 | HR 77 | Ht 66.0 in | Wt 204.0 lb

## 2022-03-25 DIAGNOSIS — D72829 Elevated white blood cell count, unspecified: Secondary | ICD-10-CM | POA: Diagnosis not present

## 2022-03-25 DIAGNOSIS — R002 Palpitations: Secondary | ICD-10-CM

## 2022-03-25 LAB — CBC WITH DIFFERENTIAL/PLATELET
Basophils Absolute: 0.1 10*3/uL (ref 0.0–0.2)
Basos: 1 %
EOS (ABSOLUTE): 0.1 10*3/uL (ref 0.0–0.4)
Eos: 2 %
Hematocrit: 41 % (ref 34.0–46.6)
Hemoglobin: 13.8 g/dL (ref 11.1–15.9)
Immature Grans (Abs): 0 10*3/uL (ref 0.0–0.1)
Immature Granulocytes: 0 %
Lymphocytes Absolute: 2.9 10*3/uL (ref 0.7–3.1)
Lymphs: 32 %
MCH: 30.8 pg (ref 26.6–33.0)
MCHC: 33.7 g/dL (ref 31.5–35.7)
MCV: 92 fL (ref 79–97)
Monocytes Absolute: 0.7 10*3/uL (ref 0.1–0.9)
Monocytes: 8 %
Neutrophils Absolute: 5.2 10*3/uL (ref 1.4–7.0)
Neutrophils: 57 %
Platelets: 457 10*3/uL — ABNORMAL HIGH (ref 150–450)
RBC: 4.48 x10E6/uL (ref 3.77–5.28)
RDW: 12.7 % (ref 11.7–15.4)
WBC: 9.1 10*3/uL (ref 3.4–10.8)

## 2022-03-25 MED ORDER — METOPROLOL TARTRATE 25 MG PO TABS: ORAL_TABLET | ORAL | 3 refills | Status: DC

## 2022-03-25 NOTE — Progress Notes (Signed)
Cardiology Office Note:    Date:  03/27/2022   ID:  [name], DOB 12/01/1955, MRN [id number]  PCP:  Shon Baton, [facility] Providers Cardiologist:  Glenetta Hew, MD     Referring MD: Shon Baton, MD   Chief Complaint  Patient presents with   Follow-up    Post hospital.    History of Present Illness:    [name] is a 66 y.o. female with a hx of PAT/PSVT and hypothyroidism.  Patient has a history of short bursts of PAT and PSVT.  Coronary calcium scoring obtained on 01/20/2018 showed a coronary calcium score of 0.  Previous heart monitor obtained in October 2020 demonstrated mostly sinus rhythm with heart rate between 48-140, average heart rate 70 bpm.  No significant abnormality was detected.  Repeat heart monitor in July 2021 showed bursts of PAT and PACs/PVCs.  Heart rate ranges between 46-131, patient was not placed on scheduled medication due to baseline bradycardia.  She was seen in the ER on 03/26/2021 for palpitation and lasted 45 minutes.  Vagal maneuver was unsuccessful.  By the time she reached the emergency room, symptom has resolved.  She has tried to minimize caffeine intake.  It was suspected patient likely had SVT.  She was ruled out of MI and was discharged.  Afterward, she was seen by Dr. Ellyn Hack on 04/08/2021, she was on metoprolol as needed.  More recently, patient presented to the ED on 03/19/2022 with palpitation and lightheadedness.  Her Apple Watch mentioned that she has 4-5 PVCs but in a minute.  Patient says she was able to feel each PVC which made her feel uncomfortable.  Blood work showed normal renal function and electrolyte, normal magnesium level.  White blood cell count 14.5, hemoglobin 14.1.  Chest x-ray was normal.  D-dimer negative.  Troponin negative x1.  Urinalysis shows small leukocyte but negative nitrite.  Since patient did not have any urinary symptoms, therefore did not treat with antibiotic.  She was placed on metoprolol succinate  25 mg daily.  Patient presents today for follow-up.  Since the recent ED visit, her palpitation has significantly improved.  She never picked up metoprolol succinate.  She did not use metoprolol tartrate when she had a palpitation either.  I asked the patient to start using metoprolol tartrate up to twice a day as needed.  She is on levothyroxine.  We will defer annual thyroid test to her PCP.  I cannot explain why her white blood cell count is elevated, patient says she does have a tooth infection, I am not sure if this is enough to cause white blood cell count to be elevated.  We plan to repeat a CBC with differential, if white blood cell count continues to trend up, she will need to see her PCP.  She denies any recent fever or chill.  She can follow-up with Dr. Ellyn Hack in 3 months as previously scheduled.   Past Medical History:  Diagnosis Date   GERD (gastroesophageal reflux disease)    Hypothyroid    Acquired.  Has had radiofrequency ablation    Past Surgical History:  Procedure Laterality Date   TONSILLECTOMY  1962   UPPER GASTROINTESTINAL ENDOSCOPY     WRIST FRACTURE SURGERY  2002   right wrist    Current Medications: Current Meds  Medication Sig   allopurinol (ZYLOPRIM) 100 MG tablet Take 200 mg by mouth daily.   ezetimibe (ZETIA) 10 MG tablet Take 10 mg by  mouth daily.   losartan-hydrochlorothiazide (HYZAAR) 50-12.5 MG tablet Take 1 tablet by mouth daily.    pantoprazole (PROTONIX) 40 MG tablet Take 40 mg by mouth daily.   SYNTHROID 137 MCG tablet Take 137 mcg by mouth daily.   [DISCONTINUED] metoprolol succinate (TOPROL-XL) 25 MG 24 hr tablet Take 1 tablet (25 mg total) by mouth daily.   [DISCONTINUED] metoprolol tartrate (LOPRESSOR) 25 MG tablet Take 1 tablet (25 mg total) by mouth daily as needed.     Allergies:   Macrodantin [nitrofurantoin]   Social History   Socioeconomic History   Marital status: Married    Spouse name: Not on file   Number of children: 1    Years of education: Not on file   Highest education level: Bachelor's degree (e.g., BA, AB, BS)  Occupational History    Employer: [employer]    Comment: Office manager  Tobacco Use   Smoking status: Never   Smokeless tobacco: Never  Vaping Use   Vaping Use: Never used  Substance and Sexual Activity   Alcohol use: No    Alcohol/week: 0.0 standard drinks of alcohol   Drug use: No   Sexual activity: Not on file  Other Topics Concern   Not on file  Social History Narrative   Married with 1 child.  No grandchildren.  Lives with her spouse.   She is quite active.  Quit smoking in 2003.   Walks 3 miles a day 7 days a week for about an hour 50 minutes a day   Social Determinants of Radio broadcast assistant Strain: Not on file  Food Insecurity: Not on file  Transportation Needs: Not on file  Physical Activity: Not on file  Stress: Not on file  Social Connections: Not on file     Family History: The patient's family history includes Breast cancer (age of onset: 1) in her maternal aunt; Breast cancer (age of onset: 28) in her paternal grandmother; Cancer in her father; High Cholesterol in her brother, father, and sister; Hypertension in her brother and mother; Thyroid disease in her mother and sister. There is no history of Colon cancer.  ROS:   Please see the history of present illness.     All other systems reviewed and are negative.  EKGs/Labs/Other Studies Reviewed:    The following studies were reviewed today:  Echo 03/31/2007 SUMMARY   -  Overall left ventricular systolic function was normal. Left         ventricular ejection fraction was estimated , range being 55         % to 65 %. There were no left ventricular regional wall         motion abnormalities.   EKG:  EKG is not ordered today.   Recent Labs: 03/19/2022: BUN 17; Creatinine, Ser 0.80; Magnesium 2.1; Potassium 3.7; Sodium 136 03/25/2022: Hemoglobin 13.8; Platelets 457  Recent Lipid  Panel    Component Value Date/Time   CHOL (H) 03/18/2007 0115    224        ATP III CLASSIFICATION:  <200     mg/dL   Desirable  200-239  mg/dL   Borderline High  >=240    mg/dL   High   TRIG 107 03/18/2007 0115   HDL 61 03/18/2007 0115   CHOLHDL 3.7 03/18/2007 0115   VLDL 21 03/18/2007 0115   LDLCALC (H) 03/18/2007 0115    142        Total Cholesterol/HDL:CHD Risk Coronary Heart  Disease Risk Table                     Men   Women  1/2 Average Risk   3.4   3.3     Risk Assessment/Calculations:           Physical Exam:    VS:  BP 124/76 (BP Location: Left Arm, Patient Position: Sitting, Cuff Size: Large)   Pulse 77   Ht '5\' 6"'$  (1.676 m)   Wt 204 lb (92.5 kg)   BMI 32.93 kg/m     Wt Readings from Last 3 Encounters:  03/25/22 204 lb (92.5 kg)  03/19/22 231 lb 7.7 oz (105 kg)  04/08/21 205 lb 6.4 oz (93.2 kg)     GEN:  Well nourished, well developed in no acute distress HEENT: Normal NECK: No JVD; No carotid bruits LYMPHATICS: No lymphadenopathy CARDIAC: RRR, no murmurs, rubs, gallops RESPIRATORY:  Clear to auscultation without rales, wheezing or rhonchi  ABDOMEN: Soft, non-tender, non-distended MUSCULOSKELETAL:  No edema; No deformity  SKIN: Warm and dry NEUROLOGIC:  Alert and oriented x 3 PSYCHIATRIC:  Normal affect   ASSESSMENT:    1. Palpitations   2. Leukocytosis, unspecified type    PLAN:    In order of problems listed above:  Palpitation: Patient has a history of PSVT and PAT.  I recommended metoprolol 25 mg twice a day as needed for increased palpitation.  Leukocytosis: She recently was seen in the emergency room for palpitation, palpitation has since improved.  Most of the lab work obtained in the ED was reassuring, it is unclear to me why her white blood cell count was elevated.  We will repeat CBC with differential, if elevated will need to see PCP.  She however does not have any sign or symptom of infection.           Medication  Adjustments/Labs and Tests Ordered: Current medicines are reviewed at length with the patient today.  Concerns regarding medicines are outlined above.  Orders Placed This Encounter  Procedures   CBC with Differential/Platelet   Meds ordered this encounter  Medications   DISCONTD: metoprolol tartrate (LOPRESSOR) 25 MG tablet    Sig: Take 1-2 tablets on the days of increased palpitations    Dispense:  60 tablet    Refill:  3   metoprolol tartrate (LOPRESSOR) 25 MG tablet    Sig: Take 1-2 tablets on the days of increased palpitations    Dispense:  60 tablet    Refill:  3    Patient Instructions  Medication Instructions:  Stop Metoprolol Succinate as directed.  Continue Metoprolol Tartrate.  *If you need a refill on your cardiac medications before your next appointment, please call your pharmacy*   Lab Work: Your physician recommends that you complete labs today CBC w Diff  If you have labs (blood work) drawn today and your tests are completely normal, you will receive your results only by: [facility] (if you have MyChart) OR A paper copy in the mail If you have any lab test that is abnormal or we need to change your treatment, we will call you to review the results.   Testing/Procedures: NONE ordered at this time of appointment     Follow-Up: At [facility], you and your health needs are our priority.  As part of our continuing mission to provide you with exceptional heart care, we have created designated Provider Care Teams.  These Care Teams include your primary Cardiologist (  physician) and Advanced Practice Providers (APPs -  Physician Assistants and Nurse Practitioners) who all work together to provide you with the care you need, when you need it.  We recommend signing up for the patient portal called "MyChart".  Sign up information is provided on this After Visit Summary.  MyChart is used to connect with patients for Virtual Visits (Telemedicine).  Patients are  able to view lab/test results, encounter notes, upcoming appointments, etc.  Non-urgent messages can be sent to your provider as well.   To learn more about what you can do with MyChart, go to [email or web address].    Your next appointment:    Keep October 2023 appointment   The format for your next appointment:   In Person  Provider:   Glenetta Hew, MD     Other Instructions   Important Information About Sugar         Hilbert Corrigan, [place]  03/27/2022 11:32 PM    [place]

## 2022-03-25 NOTE — Patient Instructions (Signed)
Medication Instructions:  Stop Metoprolol Succinate as directed.  Continue Metoprolol Tartrate.  *If you need a refill on your cardiac medications before your next appointment, please call your pharmacy*   Lab Work: Your physician recommends that you complete labs today CBC w Diff  If you have labs (blood work) drawn today and your tests are completely normal, you will receive your results only by: [facility] (if you have MyChart) OR A paper copy in the mail If you have any lab test that is abnormal or we need to change your treatment, we will call you to review the results.   Testing/Procedures: NONE ordered at this time of appointment     Follow-Up: At [facility], you and your health needs are our priority.  As part of our continuing mission to provide you with exceptional heart care, we have created designated Provider Care Teams.  These Care Teams include your primary Cardiologist (physician) and Advanced Practice Providers (APPs -  Physician Assistants and Nurse Practitioners) who all work together to provide you with the care you need, when you need it.  We recommend signing up for the patient portal called "MyChart".  Sign up information is provided on this After Visit Summary.  MyChart is used to connect with patients for Virtual Visits (Telemedicine).  Patients are able to view lab/test results, encounter notes, upcoming appointments, etc.  Non-urgent messages can be sent to your provider as well.   To learn more about what you can do with MyChart, go to [email or web address].    Your next appointment:    Keep October 2023 appointment   The format for your next appointment:   In Person  Provider:   Glenetta Hew, MD     Other Instructions   Important Information About Sugar

## 2022-03-27 ENCOUNTER — Encounter: Payer: Self-pay | Admitting: Physician Assistant

## 2022-03-27 NOTE — Progress Notes (Signed)
White blood cell count normalized. Differential is normal as well, no elevation of neutrophil to suggest bacterial infection. No further work up.

## 2022-03-30 ENCOUNTER — Telehealth: Payer: Self-pay

## 2022-03-30 NOTE — Telephone Encounter (Addendum)
Left Vm for patient to call back.  ----- Message from Almyra Deforest, [place] sent at 03/27/2022 11:35 PM EDT ----- White blood cell count normalized. Differential is normal as well, no elevation of neutrophil to suggest bacterial infection. No further work up.

## 2022-03-30 NOTE — Telephone Encounter (Signed)
Patient is returning call.  °

## 2022-06-04 ENCOUNTER — Other Ambulatory Visit: Payer: Self-pay

## 2022-06-22 ENCOUNTER — Encounter: Payer: Self-pay | Admitting: Cardiology

## 2022-06-22 ENCOUNTER — Ambulatory Visit: Payer: Managed Care, Other (non HMO) | Attending: Cardiology | Admitting: Cardiology

## 2022-06-22 VITALS — BP 124/84 | HR 68 | Ht 66.0 in | Wt 207.0 lb

## 2022-06-22 DIAGNOSIS — R002 Palpitations: Secondary | ICD-10-CM

## 2022-06-22 DIAGNOSIS — I479 Paroxysmal tachycardia, unspecified: Secondary | ICD-10-CM

## 2022-06-22 NOTE — Progress Notes (Signed)
Primary Care Provider: Shon Baton, [place] Cardiologist: Glenetta Hew, MD Electrophysiologist: None  Clinic Note: Chief Complaint  Patient presents with   Follow-up    Palpitations-much better.   ===================================  ASSESSMENT/PLAN   Problem List Items Addressed This Visit       Cardiology Problems   Paroxysmal tachycardia (HCC) (Chronic)    Seems like most of her episodes of the summer probably related to anxiety and stress.  Clearly this can trigger her SVT and PAT episodes.  Now that things are stabilized out, I think we are okay not having a STEMI medication but I do agree with PRN Lopressor she can take it up to twice a day for palpitations.  We also talked about vagal maneuvers and adequate hydration.  Additionally we talked about how the counseling coached her into using biofeedback to help slow the episodes down.      Relevant Medications   metoprolol tartrate (LOPRESSOR) 25 MG tablet   Other Relevant Orders   EKG 12-Lead (Completed)     Other   Palpitations - Primary (Chronic)    I think she still probably has some PACs and PVCs but is not really having the prominent tachycardic spells.  Talk about avoiding triggers, staying hydrated. If symptoms are persistent, would consider short acting beta-blocker => she has a prescription PRN.      Relevant Orders   EKG 12-Lead (Completed)    ===================================  HPI:    [name] is a 66 y.o. female with a PMH notable for short bursts of PAT along with PACs and PVCs (palpitations) who presents today for 58-monthfollow-up.  Coronary Calcium Score 01/20/2018: 0. Hypertension, hyperlipidemia  Ana Mcwas last seen on 03/25/2022 by HAlmyra Deforest PA-C (I saw her last a year ago in July 2022 after ER visit in June.) -->  She had been to the emergency room for palpitations and was given a prescription for metoprolol succinate, she never got that filled.  Recent  Hospitalizations:  ER visit 03/19/2022-palpitations -> Apple watch indicated that she was having PVCs quite frequently.  She was feeling this but every PVC.  She was seen in follow-up on July 6 by HAlmyra Deforest PA-noted that her palpitations notably improved.  She never did pick up the metoprolol succinate that was prescribed.  He recommended that she start taking that with Toprol tartrate on an as-needed basis and not taken to standing.  She apparently had a tooth infection at that time so had white count elevation.  Reviewed  CV studies:    The following studies were reviewed today: (if available, images/films reviewed: From Epic Chart or Care Everywhere) N/A:  Interval History:   Ana Mcreturns here today for close follow-up stating that she is doing a lot better.  She says that the episode in June all was surrounded [place] incident that happened while they were traveling [place] with their son and daughter-in-law.  Apparently he fell while on a tour, her daughter-in-law had a panic attack where she had almost seizure-like activity with almost dissociative episode.  This was devastating to her daughter-in-law, but she actually was very clear.  Happening again but was trying to stay strong for her son and daughter-in-law.  She then started having her own episodes of palpitations and fluttering and anxiety.  She was able to meet with the discussed the symptoms with a counselor, and since she has been in counseling, she is felt much better.  The palpitations  have slowly dissipated.  All she feels now is occasional skipped beats but nothing prolonged.  CV Review of Symptoms (Summary): no chest pain or dyspnea on exertion positive for - palpitations/fluttering sensations probably related to anxiety.  Much better with counseling. negative for - chest pain, dyspnea on exertion, edema, orthopnea, paroxysmal nocturnal dyspnea, shortness of breath, or syncope/near syncope or TIA/amaurosis fugax.   Claudication.  REVIEWED OF SYSTEMS   Review of Systems  Constitutional:  Negative for malaise/fatigue and weight loss.  Psychiatric/Behavioral:  The patient is nervous/anxious (Has had lots of family issues leading to anxiety and stress.  Symptoms notably improved with counseling.).     I have reviewed and (if needed) personally updated the patient's problem list, medications, allergies, past medical and surgical history, social and family history.   PAST MEDICAL HISTORY   Past Medical History:  Diagnosis Date   GERD (gastroesophageal reflux disease)    Hypothyroid    Acquired.  Has had radiofrequency ablation    PAST SURGICAL HISTORY   Past Surgical History:  Procedure Laterality Date   TONSILLECTOMY  1962   UPPER GASTROINTESTINAL ENDOSCOPY     WRIST FRACTURE SURGERY  2002   right wrist     There is no immunization history on file for this patient.  MEDICATIONS/ALLERGIES   Current Meds  Medication Sig   allopurinol (ZYLOPRIM) 100 MG tablet Take 200 mg by mouth daily.   losartan-hydrochlorothiazide (HYZAAR) 50-12.5 MG tablet Take 1 tablet by mouth daily.    pantoprazole (PROTONIX) 40 MG tablet Take 40 mg by mouth daily.   SYNTHROID 137 MCG tablet Take 137 mcg by mouth daily.    Allergies  Allergen Reactions   Macrodantin [Nitrofurantoin] Hives    Shortness of breath    SOCIAL HISTORY/FAMILY HISTORY   Reviewed in Epic:  Pertinent findings:  Social History   Tobacco Use   Smoking status: Never   Smokeless tobacco: Never  Vaping Use   Vaping Use: Never used  Substance Use Topics   Alcohol use: No    Alcohol/week: 0.0 standard drinks of alcohol   Drug use: No   Social History   Social History Narrative   Married with 1 child.  No grandchildren.  Lives with her spouse.   She is quite active.  Quit smoking in 2003.   Walks 3 miles a day 7 days a week for about an hour 50 minutes a day    OBJCTIVE -PE, EKG, labs   Wt Readings from Last 3 Encounters:   06/22/22 207 lb (93.9 kg)  03/25/22 204 lb (92.5 kg)  03/19/22 231 lb 7.7 oz (105 kg)    Physical Exam: BP 124/84 (BP Location: Left Arm, Patient Position: Sitting, Cuff Size: Large)   Pulse 68   Ht '5\' 6"'$  (1.676 m)   Wt 207 lb (93.9 kg)   SpO2 95%   BMI 33.41 kg/m  Physical Exam Vitals reviewed.  Constitutional:      General: She is not in acute distress.    Appearance: Normal appearance. She is obese. She is not ill-appearing or toxic-appearing.  HENT:     Head: Normocephalic and atraumatic.  Neck:     Vascular: No carotid bruit or JVD.  Cardiovascular:     Rate and Rhythm: Normal rate and regular rhythm. No extrasystoles are present.    Chest Wall: PMI is not displaced.     Pulses: Normal pulses.     Heart sounds: S1 normal and S2 normal. Heart sounds are distant.  No murmur heard.    No friction rub. No gallop.  Pulmonary:     Effort: Pulmonary effort is normal. No respiratory distress.     Breath sounds: Normal breath sounds. No wheezing, rhonchi or rales.  Chest:     Chest wall: No tenderness.  Musculoskeletal:        General: No swelling. Normal range of motion.     Cervical back: Normal range of motion and neck supple.  Skin:    General: Skin is warm and dry.  Neurological:     General: No focal deficit present.     Mental Status: She is alert and oriented to person, place, and time.     Gait: Gait normal.  Psychiatric:        Mood and Affect: Mood normal.        Behavior: Behavior normal.        Thought Content: Thought content normal.        Judgment: Judgment normal.     Adult ECG Report  Rate: 68 ;  Rhythm: normal sinus rhythm and normal axis, normal durations. ;   Narrative Interpretation: Normal  Recent Labs:   04/08/2022: TC 09/23/2003, TG 286, HDL 59, LDL 189; A1c 5.6.  Lab Results  Component Value Date   CREATININE 0.80 03/19/2022   BUN 17 03/19/2022   NA 136 03/19/2022   K 3.7 03/19/2022   CL 101 03/19/2022   CO2 24 03/19/2022       Latest Ref Rng & Units 03/25/2022    2:13 PM 03/19/2022    7:46 PM 03/26/2021    8:52 PM  CBC  WBC 3.4 - 10.8 x10E3/uL 9.1  14.5  8.8   Hemoglobin 11.1 - 15.9 g/dL 13.8  14.1  13.3   Hematocrit 34.0 - 46.6 % 41.0  41.4  40.0   Platelets 150 - 450 x10E3/uL 457  498  572     No results found for: "HGBA1C" Lab Results  Component Value Date   TSH 3.722 10/04/2013    ================================================== I spent a total of 24 minutes with the patient spent in direct patient consultation.  Additional time spent with chart review  / charting (studies, outside notes, etc): 10 min Total Time: 34 min  Current medicines are reviewed at length with the patient today.  (+/- concerns) N/A  Notice: This dictation was prepared with Dragon dictation along with smart phrase technology. Any transcriptional errors that result from this process are unintentional and may not be corrected upon review.  Studies Ordered:   Orders Placed This Encounter  Procedures   EKG 12-Lead   No orders of the defined types were placed in this encounter.   Patient Instructions / Medication Changes & Studies & Tests Ordered   Patient Instructions  Medication Instructions:  No changes   Do not be afraid to use your as needed Metoprolol   *If you need a refill on your cardiac medications before your next appointment, please call your pharmacy*   Lab Work:  Not needed   Testing/Procedures: Not needed   Follow-Up: At [facility], you and your health needs are our priority.  As part of our continuing mission to provide you with exceptional heart care, we have created designated Provider Care Teams.  These Care Teams include your primary Cardiologist (physician) and Advanced Practice Providers (APPs -  Physician Assistants and Nurse Practitioners) who all work together to provide you with the care you need, when you need it.  Your next appointment:   12 month(s)  The format for your  next appointment:   In Person  Provider:   Glenetta Hew, MD      Leonie Man, MD, MS Glenetta Hew, M.D., M.S. Interventional Cardiologist  [place]  Pager # [phone] Phone # [phone] [address] [place], [place] [address]   Thank you for choosing [facility]!!

## 2022-06-22 NOTE — Patient Instructions (Addendum)
Medication Instructions:  No changes   Do not be afraid to use your as needed Metoprolol   *If you need a refill on your cardiac medications before your next appointment, please call your pharmacy*   Lab Work:  Not needed   Testing/Procedures: Not needed   Follow-Up: At [facility], you and your health needs are our priority.  As part of our continuing mission to provide you with exceptional heart care, we have created designated Provider Care Teams.  These Care Teams include your primary Cardiologist (physician) and Advanced Practice Providers (APPs -  Physician Assistants and Nurse Practitioners) who all work together to provide you with the care you need, when you need it.     Your next appointment:   12 month(s)  The format for your next appointment:   In Person  Provider:   Glenetta Hew, MD

## 2022-07-06 ENCOUNTER — Ambulatory Visit (INDEPENDENT_AMBULATORY_CARE_PROVIDER_SITE_OTHER): Payer: 59 | Admitting: Psychology

## 2022-07-06 DIAGNOSIS — F4310 Post-traumatic stress disorder, unspecified: Secondary | ICD-10-CM | POA: Diagnosis not present
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
## 2022-07-11 ENCOUNTER — Encounter: Payer: Self-pay | Admitting: Cardiology

## 2022-07-11 NOTE — Assessment & Plan Note (Signed)
I think she still probably has some PACs and PVCs but is not really having the prominent tachycardic spells.  Talk about avoiding triggers, staying hydrated. If symptoms are persistent, would consider short acting beta-blocker => she has a prescription PRN.

## 2022-07-11 NOTE — Assessment & Plan Note (Signed)
Seems like most of her episodes of the summer probably related to anxiety and stress.  Clearly this can trigger her SVT and PAT episodes.  Now that things are stabilized out, I think we are okay not having a STEMI medication but I do agree with PRN Lopressor she can take it up to twice a day for palpitations.  We also talked about vagal maneuvers and adequate hydration.  Additionally we talked about how the counseling coached her into using biofeedback to help slow the episodes down.

## 2022-07-13 ENCOUNTER — Ambulatory Visit (INDEPENDENT_AMBULATORY_CARE_PROVIDER_SITE_OTHER): Payer: 59 | Admitting: Psychology

## 2022-07-13 DIAGNOSIS — F4310 Post-traumatic stress disorder, unspecified: Secondary | ICD-10-CM | POA: Diagnosis not present

## 2022-07-13 NOTE — Progress Notes (Signed)
[place] Counselor/Therapist Progress Note  Patient ID: KALIFA CADDEN, MRN: [id number],    Date: 07/13/2022  Time Spent: 60 minutes  Treatment Type: Individual Therapy  Reported Symptoms: sadness, feelings of helplessness, anxiety and anger  Mental Status Exam: Appearance:  Casual     Behavior: Appropriate  Motor: Normal  Speech/Language:  Clear and Coherent  Affect: Blunt  Mood: depressed  Thought process: normal  Thought content:   WNL  Sensory/Perceptual disturbances:   WNL  Orientation: oriented to person, place, time/date, and situation  Attention: Good  Concentration: Good  Memory: WNL  Fund of knowledge:  Good  Insight:   Good  Judgment:  Good  Impulse Control: Good   Risk Assessment: Danger to Self:  No Self-injurious Behavior: No Danger to Others: No Duty to Warn:no Physical Aggression / Violence:No  Access to Firearms a concern: No  Gang Involvement:No   Subjective: The patient attended a face-to-face individual therapy session in the office today.  The patient presents with a blunted affect and mood is somewhat depressed.  The patient reports that she did some writing and she figured out why she was so angry.  She states that she felt like when she left her old job that she had put so much effort in and the response was not like she thought it would be when she left.  We talked about this and we talked about how anger is easy for her to go to.  We discussed the need for her to learn how to experience her feelings.  She has a lot of difficulty with vulnerability.  We did a neural network map and one of the things that she has said during the session today was that she did not feel worthy.  Once we did the neural network map it was clear that when she was little she did not feel worthy because her mother did not put her in beauty pageants and like she did her other children.  We talked about the possibility of this doing some EMDR to help her  rewire this so that she does not feel unworthy now.  We also talked about her behaviors of being super responsible being a coping strategy that she has incorporated into her life to prove herself worthy.  We will continue to work with the patient on these issues and we also discussed her getting the book, Codependent No More.  Interventions: Cognitive Behavioral Therapy, Insight-Oriented, Family Systems, and Interpersonal  Diagnosis:PTSD (post-traumatic stress disorder)  Plan:Plan of Care: Treatment Plan Client Abilities/Strengths  Intelligent, motivated, insightful  Client Treatment Preferences  Outpatient Individual therapy  Client Statement of Needs  "Dr. Casimiro Needle sent me to you because of a trauma I suffered on vacation" Treatment Level  Outpatient Individual therapy  Symptoms  Excessive and/or unrealistic worry that is difficult to control occurring more days than not for at least 6  months about a number of events or activities.: (Status: maintained). Has been  exposed to a traumatic event involving actual or perceived threat of death or serious injury.: (Status: maintained). Hypervigilance (e.g., feeling constantly on edge,  experiencing concentration difficulties, having trouble falling or staying asleep, exhibiting a general  state of irritability).:  (Status: maintained). Impairment in social, occupational,  or other areas of functioning.: (Status: maintained).  Problems Addressed  Posttraumatic Stress Disorder (PTSD),  Goals 1. Eliminate or reduce the negative impact trauma related symptoms have  on social, occupational, and family functioning. Objective Participate in Eye Movement  Desensitization and Reprocessing (EMDR) to reduce emotional distress  related to traumatic thoughts, feelings, and images. Target Date: 07/07/2023 Frequency: Weekly  Progress: 0 Modality: individual Related Interventions 1. Utilize Eye Movement Desensitization and Reprocessing (EMDR) to reduce  the client's  emotional reactivity to the traumatic event and reduce PTSD symptoms. 2. Stabilize anxiety level while increasing ability to function on a daily  basis. Objective Learn and implement problem-solving strategies for realistically addressing worries. Target Date: 07/07/2023 Frequency: Weekly Progress: 0 Modality: individual Related Interventions 1. Teach the client problem-solving strategies involving specifically defining a problem,  generating options for addressing it, evaluating the pros and cons of each option, selecting and  implementing an optional action, and reevaluating and refining the action (or assign "Applying  Problem-Solving to Interpersonal Conflict" in the Adult Psychotherapy Homework Planner by  Bryn Gulling) F43.10 (Posttraumatic stress disorder) - Open - [Signifier: n/a] Posttraumatic Stress  Disorder  Conditions For Discharge Achievement of treatment goals and objectives   The patient has approved this plan   Jermain Curt G Arnell Mausolf, LCSW

## 2022-07-20 ENCOUNTER — Ambulatory Visit: Payer: 59 | Admitting: Psychology

## 2022-08-03 ENCOUNTER — Ambulatory Visit (INDEPENDENT_AMBULATORY_CARE_PROVIDER_SITE_OTHER): Payer: 59 | Admitting: Psychology

## 2022-08-03 DIAGNOSIS — F4310 Post-traumatic stress disorder, unspecified: Secondary | ICD-10-CM

## 2022-08-04 NOTE — Progress Notes (Signed)
[place] Counselor/Therapist Progress Note  Patient ID: [name], MRN: [id number],    Date: 08/03/2022  Time Spent: 60 minutes  Treatment Type: Individual Therapy  Reported Symptoms: sadness, feelings of helplessness, anxiety and anger  Mental Status Exam: Appearance:  Casual     Behavior: Appropriate  Motor: Normal  Speech/Language:  Clear and Coherent  Affect: Blunt  Mood: depressed  Thought process: normal  Thought content:   WNL  Sensory/Perceptual disturbances:   WNL  Orientation: oriented to person, place, time/date, and situation  Attention: Good  Concentration: Good  Memory: WNL  Fund of knowledge:  Good  Insight:   Good  Judgment:  Good  Impulse Control: Good   Risk Assessment: Danger to Self:  No Self-injurious Behavior: No Danger to Others: No Duty to Warn:no Physical Aggression / Violence:No  Access to Firearms a concern: No  Gang Involvement:No   Subjective: The patient attended a face-to-face individual therapy session in the office today.  The patient presents as pleasant and cooperative.  The patient reports that she feels like the therapy is changing her life.  We talked today about the knowing that she had last session about feeling unworthy.  We briefly talked about the possibility of doing EMDR to help her with this core belief about herself and she wanted to research it and get back with me about that.  The patient states that she is changing some of her behaviors and she is speaking about how she feels and standing up for herself when she needs to and this has been a significant change for her.  We will continue to work on helping her gain insight into why she does some of the things she does and how to resolve some of the issues that she experiences in her life. Interventions: Cognitive Behavioral Therapy, Insight-Oriented, Family Systems, and Interpersonal  Diagnosis:PTSD (post-traumatic stress disorder)  Plan:Plan of  Care: Treatment Plan Client Abilities/Strengths  Intelligent, motivated, insightful  Client Treatment Preferences  Outpatient Individual therapy  Client Statement of Needs  "Dr. Casimiro Needle sent me to you because of a trauma I suffered on vacation" Treatment Level  Outpatient Individual therapy  Symptoms  Excessive and/or unrealistic worry that is difficult to control occurring more days than not for at least 6  months about a number of events or activities.: (Status: maintained). Has been  exposed to a traumatic event involving actual or perceived threat of death or serious injury.: (Status: maintained). Hypervigilance (e.g., feeling constantly on edge,  experiencing concentration difficulties, having trouble falling or staying asleep, exhibiting a general  state of irritability).:  (Status: maintained). Impairment in social, occupational,  or other areas of functioning.: (Status: maintained).  Problems Addressed  Posttraumatic Stress Disorder (PTSD),  Goals 1. Eliminate or reduce the negative impact trauma related symptoms have  on social, occupational, and family functioning. Objective Participate in Eye Movement Desensitization and Reprocessing (EMDR) to reduce emotional distress  related to traumatic thoughts, feelings, and images. Target Date: 07/07/2023 Frequency: Weekly  Progress: 0 Modality: individual Related Interventions 1. Utilize Eye Movement Desensitization and Reprocessing (EMDR) to reduce the client's  emotional reactivity to the traumatic event and reduce PTSD symptoms. 2. Stabilize anxiety level while increasing ability to function on a daily  basis. Objective Learn and implement problem-solving strategies for realistically addressing worries. Target Date: 07/07/2023 Frequency: Weekly Progress: 0 Modality: individual Related Interventions 1. Teach the client problem-solving strategies involving specifically defining a problem,  generating options for addressing  it,  evaluating the pros and cons of each option, selecting and  implementing an optional action, and reevaluating and refining the action (or assign "Applying  Problem-Solving to Interpersonal Conflict" in the Adult Psychotherapy Homework Planner by  Bryn Gulling) F43.10 (Posttraumatic stress disorder) - Open - [Signifier: n/a] Posttraumatic Stress  Disorder  Conditions For Discharge Achievement of treatment goals and objectives   The patient has approved this plan   Ana Bezek G Abdirizak Richison, LCSW

## 2022-08-19 ENCOUNTER — Ambulatory Visit: Payer: 59 | Admitting: Psychology

## 2022-08-20 ENCOUNTER — Emergency Department (HOSPITAL_COMMUNITY)
Admission: EM | Admit: 2022-08-20 | Discharge: 2022-08-20 | Disposition: A | Discharge status: Home / Self Care | Payer: Managed Care, Other (non HMO) | Attending: Emergency Medicine | Admitting: Emergency Medicine

## 2022-08-20 ENCOUNTER — Emergency Department (HOSPITAL_COMMUNITY): Payer: Managed Care, Other (non HMO)

## 2022-08-20 ENCOUNTER — Other Ambulatory Visit: Payer: Self-pay

## 2022-08-20 ENCOUNTER — Other Ambulatory Visit: Payer: Self-pay | Admitting: Cardiology

## 2022-08-20 ENCOUNTER — Encounter: Payer: Self-pay | Admitting: Cardiology

## 2022-08-20 ENCOUNTER — Encounter (HOSPITAL_COMMUNITY): Payer: Self-pay | Admitting: Emergency Medicine

## 2022-08-20 DIAGNOSIS — E039 Hypothyroidism, unspecified: Secondary | ICD-10-CM | POA: Diagnosis not present

## 2022-08-20 DIAGNOSIS — Z79899 Other long term (current) drug therapy: Secondary | ICD-10-CM | POA: Diagnosis not present

## 2022-08-20 DIAGNOSIS — I480 Paroxysmal atrial fibrillation: Secondary | ICD-10-CM

## 2022-08-20 DIAGNOSIS — I4891 Unspecified atrial fibrillation: Secondary | ICD-10-CM | POA: Diagnosis not present

## 2022-08-20 DIAGNOSIS — R002 Palpitations: Secondary | ICD-10-CM | POA: Diagnosis present

## 2022-08-20 DIAGNOSIS — I4819 Other persistent atrial fibrillation: Secondary | ICD-10-CM

## 2022-08-20 LAB — CBC WITH DIFFERENTIAL/PLATELET
Abs Immature Granulocytes: 0.03 10*3/uL (ref 0.00–0.07)
Basophils Absolute: 0.1 10*3/uL (ref 0.0–0.1)
Basophils Relative: 1 %
Eosinophils Absolute: 0.3 10*3/uL (ref 0.0–0.5)
Eosinophils Relative: 4 %
HCT: 41.8 % (ref 36.0–46.0)
Hemoglobin: 14.2 g/dL (ref 12.0–15.0)
Immature Granulocytes: 0 %
Lymphocytes Relative: 48 %
Lymphs Abs: 4.3 10*3/uL — ABNORMAL HIGH (ref 0.7–4.0)
MCH: 32.1 pg (ref 26.0–34.0)
MCHC: 34 g/dL (ref 30.0–36.0)
MCV: 94.4 fL (ref 80.0–100.0)
Monocytes Absolute: 0.8 10*3/uL (ref 0.1–1.0)
Monocytes Relative: 9 %
Neutro Abs: 3.4 10*3/uL (ref 1.7–7.7)
Neutrophils Relative %: 38 %
Platelets: 426 10*3/uL — ABNORMAL HIGH (ref 150–400)
RBC: 4.43 MIL/uL (ref 3.87–5.11)
RDW: 12.4 % (ref 11.5–15.5)
WBC: 9 10*3/uL (ref 4.0–10.5)
nRBC: 0 % (ref 0.0–0.2)

## 2022-08-20 LAB — BASIC METABOLIC PANEL
Anion gap: 13 (ref 5–15)
BUN: 17 mg/dL (ref 8–23)
CO2: 25 mmol/L (ref 22–32)
Calcium: 9.5 mg/dL (ref 8.9–10.3)
Chloride: 100 mmol/L (ref 98–111)
Creatinine, Ser: 0.83 mg/dL (ref 0.44–1.00)
GFR, Estimated: >60 mL/min (ref >60)
Glucose, Bld: 133 mg/dL — ABNORMAL HIGH (ref 70–99)
Potassium: 3.9 mmol/L (ref 3.5–5.1)
Sodium: 138 mmol/L (ref 135–145)

## 2022-08-20 LAB — MAGNESIUM: Magnesium: 2.1 mg/dL (ref 1.7–2.4)

## 2022-08-20 LAB — TSH: TSH: 2.915 u[IU]/mL (ref 0.350–4.500)

## 2022-08-20 MED ORDER — DILTIAZEM HCL ER COATED BEADS 120 MG PO CP24: 120.0000 mg | ORAL_CAPSULE | Freq: Every day | ORAL | 0 refills | Status: DC

## 2022-08-20 MED ORDER — SODIUM CHLORIDE 0.9 % IV BOLUS (SEPSIS)
1000.0000 mL | Freq: Once | INTRAVENOUS | Status: AC
  Administered 2022-08-20: 1000 mL via INTRAVENOUS

## 2022-08-20 MED ORDER — ONDANSETRON HCL 4 MG/2ML IJ SOLN
4.0000 mg | Freq: Once | INTRAMUSCULAR | Status: AC
  Administered 2022-08-20: 4 mg via INTRAVENOUS
  Filled 2022-08-20: qty 2

## 2022-08-20 MED ORDER — APIXABAN 5 MG PO TABS: 5.0000 mg | ORAL_TABLET | Freq: Two times a day (BID) | ORAL | 0 refills | Status: DC

## 2022-08-20 MED ORDER — APIXABAN 5 MG PO TABS
5.0000 mg | ORAL_TABLET | Freq: Two times a day (BID) | ORAL | Status: DC
  Administered 2022-08-20: 5 mg via ORAL
  Filled 2022-08-20: qty 1

## 2022-08-20 MED ORDER — DILTIAZEM HCL ER COATED BEADS 120 MG PO CP24
120.0000 mg | ORAL_CAPSULE | Freq: Every day | ORAL | Status: DC
  Administered 2022-08-20: 120 mg via ORAL
  Filled 2022-08-20 (×2): qty 1

## 2022-08-20 MED ORDER — DILTIAZEM LOAD VIA INFUSION
10.0000 mg | Freq: Once | INTRAVENOUS | Status: AC
  Administered 2022-08-20: 10 mg via INTRAVENOUS
  Filled 2022-08-20: qty 10

## 2022-08-20 MED ORDER — DILTIAZEM HCL-DEXTROSE 125-5 MG/125ML-% IV SOLN (PREMIX)
5.0000 mg/h | INTRAVENOUS | Status: DC
  Administered 2022-08-20: 5 mg/h via INTRAVENOUS
  Filled 2022-08-20: qty 125

## 2022-08-20 NOTE — ED Notes (Signed)
Pt remains on bedside commode at this time

## 2022-08-20 NOTE — Consult Note (Signed)
Cardiology Consult   Patient ID: [name] MRN: [id number]; DOB: 1956-04-08   Admission date: 08/20/2022  PCP:  Shon Baton, MD   [place] [facility] Cardiologist:  Glenetta Hew, MD        Chief Complaint: Palpitations  Patient Profile:   [name] is a 66 y.o. female with history of GERD, hypothyroidism (acquired secondary to radiofrequency ablation), paroxysmal tachycardia who is being seen 08/20/2022 for the evaluation of presumed new onset atrial fibrillation with rapid ventricular response.  History of Present Illness:   [name] is a 66 year old female with above-noted medical history who presented to the emergency department early this morning after awakening around 3 AM with a sensation of racing heartbeat.  Patient checked heart rate on a home cardia monitor and says that the rate was reported to be 220.  Patient took her home metoprolol and then presented to the emergency department.  In the ED, patient noted to be in afib with RVR, labs unremarkable. She was given a cardizem bolus/placed on infusion with rapid rate improvement. Review of telemetry shows spontaneous conversion to NSR at 1:09 PM today.   On my exam in the ED, patient is without chest pain, palpitations, shortness of breath. She confirms above history with added detail that at the time she had palpitations, she was also diaphoretic with nausea. She experienced multiple episodes of diarrhea as well. No recent illness, fevers, chills, body aches. Patient says that this past week has been particularly stressful as she had to place her mother into hospice care after a long battle with cancer. Regarding risk factors for afib, patient denies family history of arrhythmias. Reports that her husband often tells her that she snores/stops breathing at night.   Cardiac history:  Patient with a history of paroxysmal tachycardia.  Historically has worn a monitor three times.  The most recent was in  August 2021, predominant rhythm sinus.  1 run of 4 beat NSVT, 10 episodes of paroxysmal atrial/supraventricular tachycardia.  Fastest was 209 bpm for 5 beats, longest was 13 beats for 103 bpm.  Rare PACs and PVCs noted.  Total of 26 triggered events, majority of events were PACs or PVCs.  The second was in 2020 and showed findings of primarily sinus rhythm and occasional PVCs/PACs.  Patient also wore a Holter monitor in 2016 with findings of normal sinus rhythm with occasional sinus tachycardia, nonsustained atrial tachycardia, rare PACs.  She was last seen in office by Dr. Ellyn Hack on 06/22/2022.  At the time of this office visit patient reports that palpitation frequency had improved secondary to decreased stress.  Patient transitioned to taking as needed Lopressor.  Past Medical History:  Diagnosis Date   GERD (gastroesophageal reflux disease)    Hypothyroid    Acquired.  Has had radiofrequency ablation    Past Surgical History:  Procedure Laterality Date   TONSILLECTOMY  1962   UPPER GASTROINTESTINAL ENDOSCOPY     WRIST FRACTURE SURGERY  2002   right wrist     Medications Prior to Admission: Prior to Admission medications   Medication Sig Start Date End Date Taking? Authorizing Provider  allopurinol (ZYLOPRIM) 100 MG tablet Take 200 mg by mouth daily. 04/07/21   [provider]  losartan-hydrochlorothiazide (HYZAAR) 50-12.5 MG tablet Take 1 tablet by mouth daily.  03/15/16   [provider]  metoprolol tartrate (LOPRESSOR) 25 MG tablet Take 1 tablet by mouth daily as needed. Patient not taking: Reported on 06/22/2022  [provider]  pantoprazole (PROTONIX) 40 MG tablet Take 40 mg by mouth daily. 02/13/20   [provider]  SYNTHROID 137 MCG tablet Take 137 mcg by mouth daily. 01/08/20   [provider]     Allergies:    Allergies  Allergen Reactions   Macrodantin [Nitrofurantoin] Hives    Shortness of breath    Social History:   Social  History   Socioeconomic History   Marital status: Married    Spouse name: Not on file   Number of children: 1   Years of education: Not on file   Highest education level: Bachelor's degree (e.g., BA, AB, BS)  Occupational History    Employer: [employer]    Comment: Office manager  Tobacco Use   Smoking status: Never   Smokeless tobacco: Never  Vaping Use   Vaping Use: Never used  Substance and Sexual Activity   Alcohol use: No    Alcohol/week: 0.0 standard drinks of alcohol   Drug use: No   Sexual activity: Not on file  Other Topics Concern   Not on file  Social History Narrative   Married with 1 child.  No grandchildren.  Lives with her spouse.   She is quite active.  Quit smoking in 2003.   Walks 3 miles a day 7 days a week for about an hour 50 minutes a day   Social Determinants of Radio broadcast assistant Strain: Not on file  Food Insecurity: Not on file  Transportation Needs: Not on file  Physical Activity: Not on file  Stress: Not on file  Social Connections: Not on file  Intimate Partner Violence: Not on file    Family History:   The patient's family history includes Breast cancer (age of onset: 71) in her maternal aunt; Breast cancer (age of onset: 70) in her paternal grandmother; Cancer in her father; High Cholesterol in her brother, father, and sister; Hypertension in her brother and mother; Thyroid disease in her mother and sister. There is no history of Colon cancer.    ROS:  Please see the history of present illness.  All other ROS reviewed and negative.     Physical Exam/Data:   Vitals:   08/20/22 1300 08/20/22 1314 08/20/22 1315 08/20/22 1330  BP: 114/83  103/72 137/82  Pulse: (!) 103 66 68 70  Resp: '15 18 16 '$ (!) 26  Temp:      TempSrc:      SpO2: 97% 96% 96% 98%  Weight:      Height:        Intake/Output Summary (Last 24 hours) at 08/20/2022 1353 Last data filed at 08/20/2022 0522 Gross per 24 hour  Intake 1000 ml   Output --  Net 1000 ml      08/20/2022    4:45 AM 06/22/2022   11:33 AM 03/25/2022    1:35 PM  Last 3 Weights  Weight (lbs) 210 lb 207 lb 204 lb  Weight (kg) 95.255 kg 93.895 kg 92.534 kg     Body mass index is 33.89 kg/m.  General:  Well nourished, well developed, in no acute distress HEENT: normal Neck: no JVD Vascular: No carotid bruits; Distal pulses 2+ bilaterally   Cardiac:  normal S1, S2; RRR; no murmur Lungs:  clear to auscultation bilaterally, no wheezing, rhonchi or rales  Abd: soft, nontender, no hepatomegaly  Ext: no edema Musculoskeletal:  No deformities, BUE and BLE strength normal and equal Skin: warm and dry  Neuro:  CNs 2-12 intact, no focal abnormalities noted Psych:  Normal affect    EKG:  The ECG that was done today in the ER was personally reviewed and demonstrates atrial fibrillation with rapid ventricular response, ventricular rate is approximately 140.  Relevant CV Studies:  N/A  Laboratory Data:  High Sensitivity Troponin:  No results for input(s): "TROPONINIHS" in the last 720 hours.    Chemistry Recent Labs  Lab 08/20/22 0430  NA 138  K 3.9  CL 100  CO2 25  GLUCOSE 133*  BUN 17  CREATININE 0.83  CALCIUM 9.5  MG 2.1  GFRNONAA >60  ANIONGAP 13    No results for input(s): "PROT", "ALBUMIN", "AST", "ALT", "ALKPHOS", "BILITOT" in the last 168 hours. Lipids No results for input(s): "CHOL", "TRIG", "HDL", "LABVLDL", "LDLCALC", "CHOLHDL" in the last 168 hours. Hematology Recent Labs  Lab 08/20/22 0430  WBC 9.0  RBC 4.43  HGB 14.2  HCT 41.8  MCV 94.4  MCH 32.1  MCHC 34.0  RDW 12.4  PLT 426*   Thyroid  Recent Labs  Lab 08/20/22 0430  TSH 2.915   BNPNo results for input(s): "BNP", "PROBNP" in the last 168 hours.  DDimer No results for input(s): "DDIMER" in the last 168 hours.   Radiology/Studies:  DG Chest Port 1 View  Result Date: 08/20/2022 CLINICAL DATA:  Encounter for chest pain. EXAM: PORTABLE CHEST 1 VIEW  COMPARISON:  PA Lat 03/19/2022 FINDINGS: The heart size and mediastinal contours are within normal limits. Both lungs are clear. The visualized skeletal structures are unremarkable. IMPRESSION: No acute radiographic chest findings.  Stable chest. Electronically Signed   By: Telford Nab M.D.   On: 08/20/2022 05:04     Assessment and Plan:   [name] is a 66 y.o. female with history of GERD, hypothyroidism (acquired secondary to radiofrequency ablation), paroxysmal tachycardia who is being seen 08/20/2022 for the evaluation of presumed new onset atrial fibrillation with rapid ventricular response.   Presumed new onset atrial fibrillation with RVR  Patient with history of infrequent PACs/PVCs and paroxysmal atrial/supraventricular tachycardia in the ED due to afib with RVR. Sontaneous conversion to NSR at 1:09PM following rate control with Diltiazem infusion.  Patient with CHA2DS2-VASc Score = 2. Given proximity to age 60, will initiate [place], Eliquis '5mg'$  BID.  TTE ordered Patient has been taking Troprol PRN for palpitations. Given positive response to Diltiazem, would recommend discharging on Cardizem CD '120mg'$ . She can take an extra dose of Cardizem with elevated rates if needed. She should discontinue PRN Toprol.  Patient already scheduled for gen cards follow up on 12/5, may benefit from afib clinic referral. Recommend outpatient sleep study  Hypertension  Patient hx hypertension. Reports at goal BP on Losartan/HCTZ.    Risk Assessment/Risk Scores:         CHA2DS2-VASc Score = 2   This indicates a 2.2% annual risk of stroke. The patient's score is based upon: CHF History: 0 HTN History: 1 Diabetes History: 0 Stroke History: 0 Vascular Disease History: 0 Age Score: 0 Gender Score: 1       For questions or updates, please contact [place] Please consult [email or web address] for contact info under     Signed, Lily Kocher, PA-C  08/20/2022 1:53 PM

## 2022-08-20 NOTE — Progress Notes (Signed)
   Patient contacted me via our answering service due to confusion about being discharged from the ED and completing echocardiogram as an outpatient. Earlier today when patient was in atrial fibrillation, there was discussion about admitting the patient for eventual TEE guided cardioversion. However, as patient converted to normal sinus rhythm, there was no need for TEE guided cardioversion. As patient converted back to normal sinus rhythm spontaneously and as her symptoms had resolved, it was determined that she did not need to be admitted to the hospital and was OK to be discharged from the ED. Patient has a follow up appointment on 12/5 with Coletta Memos NP. An outpatient transthoracic echocardiogram and cardiac monitor have been ordered and messages have been sent to the appropriate staff to get these studies scheduled. Explained this to the patient who voiced understanding.   Margie Billet, PA-C 08/20/2022 6:50 PM

## 2022-08-20 NOTE — ED Provider Triage Note (Signed)
  Emergency Medicine Provider Triage Evaluation Note  MRN:  [id number]  Arrival date & time: 08/20/22    Medically screening exam initiated at 4:59 AM.   CC:   Chest Pain  HPI:  [name] is a 66 y.o. year-old female presents to the ED with chief complaint of chest pain.  Awakened from sleep with racing heart.  History of SVT.  States HR was in the 220s on a home monitor.  She took '25mg'$  Metoprolol and came to the ER.  History provided by patient. ROS:  -As included in HPI PE:   Vitals:   08/20/22 0445 08/20/22 0445  BP: (!) 145/102   Pulse: (!) 115   Resp: 18   Temp:  98.2 F (36.8 C)  SpO2: 100%     Non-toxic appearing No respiratory distress Tachycardic Irregularly irregular MDM:  Based on signs and symptoms, afib with RVR is highest on my differential. I've ordered labs in triage to expedite lab/diagnostic workup.  Patient was informed that the remainder of the evaluation will be completed by another provider, this initial triage assessment does not replace that evaluation, and the importance of remaining in the ED until their evaluation is complete.    Montine Circle, PA-C 08/20/22 [phone]

## 2022-08-20 NOTE — ED Triage Notes (Signed)
PT woke up from sleep around 0310 and heart monitor showed in 210s. Does not take blood thinners. Took her PRN dose of '25mg'$  metoprolol. Pt did not feel better so decided to come to ED. Is in afib RVR new onset and does not take blood thinners.

## 2022-08-20 NOTE — ED Provider Notes (Signed)
[facility] EMERGENCY DEPARTMENT Provider Note   CSN: [id number] Arrival date & time: 08/20/22  7062     History  Chief Complaint  Patient presents with   Tachycardia    [name] is a 66 y.o. female.  The history is provided by the patient.  Patient with history of GERD, hypothyroid previous history of SVT presents with palpitations.  She reports around 3:10 AM she woke up with her heart racing.  She reports her heart monitor at home stated her heart rate was in the 220s.  She took her home metoprolol and came to the ER.  She reports nausea but no vomiting.  She reports diarrhea.  She reports mild chest tightness and shortness of breath.  Her biggest concern is the palpitations    Past Medical History:  Diagnosis Date   GERD (gastroesophageal reflux disease)    Hypothyroid    Acquired.  Has had radiofrequency ablation    Home Medications Prior to Admission medications   Medication Sig Start Date End Date Taking? Authorizing Provider  allopurinol (ZYLOPRIM) 100 MG tablet Take 200 mg by mouth daily. 04/07/21   [provider]  losartan-hydrochlorothiazide (HYZAAR) 50-12.5 MG tablet Take 1 tablet by mouth daily.  03/15/16   [provider]  metoprolol tartrate (LOPRESSOR) 25 MG tablet Take 1 tablet by mouth daily as needed. Patient not taking: Reported on 06/22/2022    [provider]  pantoprazole (PROTONIX) 40 MG tablet Take 40 mg by mouth daily. 02/13/20   [provider]  SYNTHROID 137 MCG tablet Take 137 mcg by mouth daily. 01/08/20   [provider]      Allergies    Macrodantin [nitrofurantoin]    Review of Systems   Review of Systems  Respiratory:  Positive for chest tightness.   Cardiovascular:  Positive for palpitations.  Gastrointestinal:  Positive for diarrhea and nausea. Negative for vomiting.  Neurological:  Negative for syncope.    Physical Exam Updated Vital Signs BP (!) 145/102   Pulse  (!) 115   Temp 98.2 F (36.8 C) (Oral)   Resp 18   Ht 1.676 m ('5\' 6"'$ )   Wt 95.3 kg   SpO2 100%   BMI 33.89 kg/m  Physical Exam CONSTITUTIONAL: Well developed/well nourished, mildly anxious HEAD: Normocephalic/atraumatic EYES: EOMI/PERRL ENMT: Mucous membranes moist NECK: supple no meningeal signs CV: Tachycardic and irregular LUNGS: Lungs are clear to auscultation bilaterally, no apparent distress ABDOMEN: soft, nontender NEURO: Pt is awake/alert/appropriate, moves all extremitiesx4.  No facial droop.   EXTREMITIES: pulses normal/equal, full ROM SKIN: warm, color normal PSYCH: Anxious  ED Results / Procedures / Treatments   Labs (all labs ordered are listed, but only abnormal results are displayed) Labs Reviewed  CBC WITH DIFFERENTIAL/PLATELET - Abnormal; Notable for the following components:      Result Value   Platelets 426 (*)    Lymphs Abs 4.3 (*)    All other components within normal limits  BASIC METABOLIC PANEL - Abnormal; Notable for the following components:   Glucose, Bld 133 (*)    All other components within normal limits  MAGNESIUM  TSH    EKG EKG Interpretation  Date/Time:  Friday August 20 2022 04:20:33 EST Ventricular Rate:  140 PR Interval:    QRS Duration: 84 QT Interval:  252 QTC Calculation: 384 R Axis:   61 Text Interpretation: Atrial fibrillation with rapid ventricular response Confirmed by Ripley Fraise [phone]) on 08/20/2022 4:38:34 AM  Radiology DG Chest  Port 1 View  Result Date: 08/20/2022 CLINICAL DATA:  Encounter for chest pain. EXAM: PORTABLE CHEST 1 VIEW COMPARISON:  PA Lat 03/19/2022 FINDINGS: The heart size and mediastinal contours are within normal limits. Both lungs are clear. The visualized skeletal structures are unremarkable. IMPRESSION: No acute radiographic chest findings.  Stable chest. Electronically Signed   By: Telford Nab M.D.   On: 08/20/2022 05:04    Procedures .Critical Care  Performed by: Ripley Fraise, MD Authorized by: Ripley Fraise, MD   Critical care provider statement:    Critical care time (minutes):  60   Critical care start time:  08/20/2022 5:15 AM   Critical care end time:  08/20/2022 6:15 AM   Critical care time was exclusive of:  Separately billable procedures and treating other patients   Critical care was necessary to treat or prevent imminent or life-threatening deterioration of the following conditions:  Cardiac failure and circulatory failure   Critical care was time spent personally by me on the following activities:  Obtaining history from patient or surrogate, examination of patient, development of treatment plan with patient or surrogate, evaluation of patient's response to treatment, re-evaluation of patient's condition, pulse oximetry, ordering and review of radiographic studies, ordering and review of laboratory studies, ordering and performing treatments and interventions and review of old charts   I assumed direction of critical care for this patient from another provider in my specialty: no       Medications Ordered in ED Medications  diltiazem (CARDIZEM) 1 mg/mL load via infusion 10 mg (10 mg Intravenous Bolus from Bag 08/20/22 0602)    And  diltiazem (CARDIZEM) 125 mg in dextrose 5% 125 mL (1 mg/mL) infusion (5 mg/hr Intravenous New Bag/Given 08/20/22 0602)  sodium chloride 0.9 % bolus 1,000 mL (0 mLs Intravenous Stopped 08/20/22 0522)  ondansetron (ZOFRAN) injection 4 mg (4 mg Intravenous Given 08/20/22 0455)    ED Course/ Medical Decision Making/ A&P Clinical Course as of 08/20/22 0711  Fri Aug 20, 2022  0546 Patient reports feeling improved but does appear anxious.  She is still in atrial fibrillation after IV fluids.  She is very hesitant to proceed with cardioversion.  She would like to try IV medications first.  We will start Cardizem [DW]  0546 Glucose(!): 133 Hyperglycemia [DW]  0651 Patient feeling improved.  Heart rate is improved.  Will  continue IV drip [DW]  [phone] Signed out to Dr. Sherry Ruffing  at shift change.  He will follow-up to see if A-fib resolves.  If she does get discharged she will need to be placed on Eliquis. [DW]    Clinical Course User Index [DW] Ripley Fraise, MD       CHA2DS2-VASc Score: 2                    Medical Decision Making Amount and/or Complexity of Data Reviewed Labs: ordered. Decision-making details documented in ED Course.  Risk Prescription drug management.   This patient presents to the ED for concern of palpitations, this involves an extensive number of treatment options, and is a complaint that carries with it a high risk of complications and morbidity.  The differential diagnosis includes but is not limited to SVT, atrial fibrillation, atrial flutter, ventricular tachycardia  Comorbidities that complicate the patient evaluation: Patient's presentation is complicated by their history of SVT  Additional history obtained: Additional history obtained from spouse Records reviewed  cardiology notes reviewed  Lab Tests: I Ordered, and personally interpreted labs.  The pertinent results include: Mild hyperglycemia  Imaging Studies ordered: I ordered imaging studies including X-ray chest   I independently visualized and interpreted imaging which showed no acute findings I agree with the radiologist interpretation  Cardiac Monitoring: The patient was maintained on a cardiac monitor.  I personally viewed and interpreted the cardiac monitor which showed an underlying rhythm of:  Atrial Fibrillation  Medicines ordered and prescription drug management: I ordered medication including IV fluids for possible dehydration IV Cardizem Reevaluation of the patient after these medicines showed that the patient    improved  Critical Interventions:   IV Cardizem  Reevaluation: After the interventions noted above, I reevaluated the patient and found that they have :improved  Complexity of  problems addressed: Patient's presentation is most consistent with  acute presentation with potential threat to life or bodily function          Final Clinical Impression(s) / ED Diagnoses Final diagnoses:  Atrial fibrillation with RVR ([place])    Rx / DC Orders ED Discharge Orders          Ordered    Amb referral to AFIB Clinic        08/20/22 0501              Ripley Fraise, MD 08/20/22 [phone]

## 2022-08-20 NOTE — ED Provider Notes (Signed)
7:11 AM Care assumed from Dr. Christy Gentles.  At time of transfer of care, patient is awaiting for reassessment after Cardizem infusion.  Patient has now been rate controlled but still in atrial fibrillation at time of signout.  Patient initially was offered cardioversion but she was very hesitant.  Other option was for admission.  Anticipate reassessment in several hours to determine if she will need admission versus cardioversion and discharge home.  10:20 AM I reassessed patient heart rate is still jumping up into the 120s at times and is still in A-fib.  I further clarified and the patient does she she has had some palpitations over the past while and has been feeling more fatigued and short of breath intermittently for the last week or 2.  She does report an increase in stress as her mother seems to be nearing end-of-life discussions.  Patient also says she has had SVT in the past but this felt different.  Given the unclear onset of fatigue and palpitations and shortness of breath, I do suspect patient may have more chronically been in A-fib and just went into a flutter or A-fib with RVR overnight.  Given her unclear onset, will discuss with cardiology anticipate admission to continue on Cardizem and admit for echo and further new A-fib workup.  Cardiology saw patient and agreed with admission initially.  Then patient had conversion to sinus rhythm.  Plan is to get echo and discussed with cardiology.  Cardiology will wait for the echo results but if it is reassuring, they would like her to be discharged home with Cardizem and anticoagulation.  They will have her follow-up in clinic.  Care transferred to oncoming team to await for final results of echo.Sherry Ruffing, Gwenyth Allegra, MD 08/20/22 [phone]

## 2022-08-20 NOTE — ED Notes (Signed)
Provider aware that the fluids are completed

## 2022-08-20 NOTE — Discharge Instructions (Signed)
You were seen in the emergency department for atrial fibrillation.  Your heart rate went back into a normal sinus rhythm after being given a medication.  After discussing with the cardiologist they would like for you to follow-up with them in clinic and will call you about an appointment.  They will also send you a Zio patch (outpatient monitor) to watch your heart rate.  Please take the Eliquis and diltiazem we have prescribed you until you are able to follow-up with cardiology.  Return immediately to the emergency department if you experience any of the following: Bleeding, chest pain, shortness of breath, or any other concerning symptoms.

## 2022-08-20 NOTE — ED Notes (Signed)
Ed provider at bedside

## 2022-08-20 NOTE — ED Notes (Signed)
Pt is stating that she needs to have a BM. Pt was advised would bring her a bedside commode. That this RN didn't wish for her to walk to the bathroom. Pt is up to bedside commode without assistance at this time

## 2022-08-20 NOTE — ED Notes (Signed)
Contacted lab to determine if they could add labs that are ordered at this time and was advised that they could

## 2022-08-20 NOTE — Progress Notes (Signed)
    Outpatient echo and Zio monitor will be arranged and patient has cardiology clinic follow up on Tuesday 12/5. She is okay for discharge from cardiology perspective and does not need inpatient TTE.

## 2022-08-20 NOTE — ED Notes (Signed)
Entered room and pt is back in the bed at this time without assistance

## 2022-08-20 NOTE — ED Provider Notes (Signed)
  Physical Exam  BP 121/76   Pulse 74   Temp 98.1 F (36.7 C) (Oral)   Resp 19   Ht '5\' 6"'$  (1.676 m)   Wt 95.3 kg   SpO2 96%   BMI 33.89 kg/m   Physical Exam  Procedures  Procedures  ED Course / MDM   Clinical Course as of 08/22/22 1136  Fri Aug 20, 2022  0546 Patient reports feeling improved but does appear anxious.  She is still in atrial fibrillation after IV fluids.  She is very hesitant to proceed with cardioversion.  She would like to try IV medications first.  We will start Cardizem [DW]  0546 Glucose(!): 133 Hyperglycemia [DW]  0651 Patient feeling improved.  Heart rate is improved.  Will continue IV drip [DW]  [phone] Signed out to Dr. Sherry Ruffing  at shift change.  He will follow-up to see if A-fib resolves.  If she does get discharged she will need to be placed on Eliquis. [DW]  1510 Assumed care from Dr. Sherry Ruffing.  66 year old female with a history of new onset atrial fibrillation with RVR.  Rate controlled with diltiazem and is now back in normal sinus rhythm.  Discussed with cardiology who feels the patient can be discharged home with Cardizem and Eliquis if her echo is normal. [RP]  1819 Cardiology has reviewed the case and does not feel that inpatient echo is required anymore.  They will set the patient up with a an outpatient Zio patch.  Patient given prescription for Cardizem and Eliquis. [RP]    Clinical Course User Index [DW] Ripley Fraise, MD [RP] Fransico Meadow, MD   Medical Decision Making Amount and/or Complexity of Data Reviewed Labs: ordered. Decision-making details documented in ED Course.  Risk Prescription drug management. Decision regarding hospitalization.     Fransico Meadow, MD 08/22/22 1137

## 2022-08-21 NOTE — Telephone Encounter (Signed)
Looks like [name], [place] & Dr. Audie Box saw her in ER -- they recommended Diltiazem XT 120 mg & started Eliquis.   OK to combine Dilt with ARB-HCTZ (Dilt is not much of a BP med - but it is possible that we may reduce the ARB-HCTZ dose if we have to increase Dilt). Still OK to use PRN Lopressor.  She has an Appt to see Evelena Peat on 12/5.   Glenetta Hew, MD

## 2022-08-22 NOTE — Progress Notes (Signed)
Cardiology Clinic Note   Patient Name: [name] Date of Encounter: 08/24/2022  Primary Care Provider:  Shon Baton, MD Primary Cardiologist:  Glenetta Hew, MD  Patient Profile    [name] 66 year old female presents the clinic today for follow-up evaluation of her atrial fibrillation with RVR.  Past Medical History    Past Medical History:  Diagnosis Date   GERD (gastroesophageal reflux disease)    Hypothyroid    Acquired.  Has had radiofrequency ablation   Past Surgical History:  Procedure Laterality Date   TONSILLECTOMY  1962   UPPER GASTROINTESTINAL ENDOSCOPY     WRIST FRACTURE SURGERY  2002   right wrist    Allergies  Allergies  Allergen Reactions   Macrodantin [Nitrofurantoin] Hives    Shortness of breath    History of Present Illness    [name] is a PMH of hypothyroidism with previous history of SVT, GERD and paroxysmal atrial fibrillation with RVR.  She reported that around 3 AM she woke up and her heart was racing.  She checked her pulse rate via monitor at home and reported a heart rate in the 220s.  She took her home metoprolol and presented to the emergency department.  She noted nausea but did not vomit.  She reported diarrhea.  She noted mild chest tightness and shortness of breath.  Her main complaint was palpitations.  She received IV Cardizem and her heart rate decreased.  She was noted to still be in atrial fibrillation.  She was offered DCCV but was hesitant.  She was also offered admission.  She was reassessed at 10:20 AM.  Her heart rate was noted to be in the 120s and she continued to be in atrial fibrillation.  Etiology of her atrial fibrillation was unclear.  Plan was made for admission for echocardiogram as well as further workup for atrial fibrillation.  She was started on p.o. diltiazem and apixaban 5 mg twice daily.  Her CBC showed slightly elevated platelets 426.  Her chest x-ray showed no acute abnormalities.  Her BMP  showed slightly elevated glucose at 133.  Cardiology followed up with patient and she was noted to be in sinus rhythm.  Outpatient evaluation with cardiac event monitor was planned.  She was discharged in stable condition.  She presents to the clinic today for follow-up evaluation and states she had a more severe episode of symptoms similar to when she presented to the hospital that were shorter in duration.  Dr. Ellyn Hack recommended Multaq therapy.  She had several questions about atrial fibrillation, antiarrhythmic medication, and treatment.  We used shared decision making to review need for echocardiogram, coronary CTA, and sleep evaluation as well as cardiac event monitor.  She presented with her husband.  She wished to continue diltiazem at this time and wait on results from cardiac event monitor.  She works at [employer].  I will order echocardiogram, coronary CTA, split-night sleep study, 14-day cardiac event monitor, and continue her diltiazem.  We will refer to atrial fibrillation clinic and have her avoid triggers for palpitations.  She expressed understanding.  We reviewed importance of continued apixaban therapy.  We will plan follow-up for after coronary CTA with Dr. Ellyn Hack.  Today she denies chest pain, shortness of breath, lower extremity edema, fatigue,  melena, hematuria, hemoptysis, diaphoresis, weakness, presyncope, syncope, orthopnea, and PND.   Home Medications    Prior to Admission medications   Medication Sig Start Date End Date Taking? Authorizing Provider  allopurinol (ZYLOPRIM) 100 MG tablet Take 200 mg by mouth daily. 04/07/21   [provider]  apixaban (ELIQUIS) 5 MG TABS tablet Take 1 tablet (5 mg total) by mouth 2 (two) times daily. 08/20/22 09/19/22  Henderly, Britni A, PA-C  diltiazem (CARDIZEM CD) 120 MG 24 hr capsule Take 1 capsule (120 mg total) by mouth daily. 08/20/22 09/19/22  Henderly, Britni A, PA-C  losartan-hydrochlorothiazide (HYZAAR) 50-12.5  MG tablet Take 1 tablet by mouth daily.  03/15/16   [provider]  pantoprazole (PROTONIX) 40 MG tablet Take 40 mg by mouth daily. 02/13/20   [provider]  SYNTHROID 137 MCG tablet Take 137 mcg by mouth daily. 01/08/20   [provider]    Family History    Family History  Problem Relation Age of Onset   Thyroid disease Mother    Hypertension Mother    Thyroid disease Sister    High Cholesterol Sister    Cancer Father    High Cholesterol Father    Hypertension Brother    High Cholesterol Brother    Breast cancer Maternal Aunt 80   Breast cancer Paternal Grandmother 60   Colon cancer Neg Hx    She indicated that her mother is alive. She indicated that her father is deceased. She indicated that her sister is alive. She indicated that both of her brothers are alive. She indicated that the status of her paternal grandmother is unknown. She indicated that the status of her maternal aunt is unknown. She indicated that the status of her neg hx is unknown.  Social History    Social History   Socioeconomic History   Marital status: Married    Spouse name: Not on file   Number of children: 1   Years of education: Not on file   Highest education level: Bachelor's degree (e.g., BA, AB, BS)  Occupational History    Employer: [employer]    Comment: Office manager  Tobacco Use   Smoking status: Never   Smokeless tobacco: Never  Vaping Use   Vaping Use: Never used  Substance and Sexual Activity   Alcohol use: No    Alcohol/week: 0.0 standard drinks of alcohol   Drug use: No   Sexual activity: Not on file  Other Topics Concern   Not on file  Social History Narrative   Married with 1 child.  No grandchildren.  Lives with her spouse.   She is quite active.  Quit smoking in 2003.   Walks 3 miles a day 7 days a week for about an hour 50 minutes a day   Social Determinants of Radio broadcast assistant Strain: Not on file  Food  Insecurity: Not on file  Transportation Needs: Not on file  Physical Activity: Not on file  Stress: Not on file  Social Connections: Not on file  Intimate Partner Violence: Not on file     Review of Systems    General:  No chills, fever, night sweats or weight changes.  Cardiovascular:  No chest pain, dyspnea on exertion, edema, orthopnea, palpitations, paroxysmal nocturnal dyspnea. Dermatological: No rash, lesions/masses Respiratory: No cough, dyspnea Urologic: No hematuria, dysuria Abdominal:   No nausea, vomiting, diarrhea, bright red blood per rectum, melena, or hematemesis Neurologic:  No visual changes, wkns, changes in mental status. All other systems reviewed and are otherwise negative except as noted above.  Physical Exam    VS:  BP 118/86   Pulse 77   Ht '5\' 6"'$  (  1.676 m)   Wt 203 lb 3.2 oz (92.2 kg)   SpO2 97%   BMI 32.80 kg/m  , BMI Body mass index is 32.8 kg/m. GEN: Well nourished, well developed, in no acute distress. HEENT: normal. Neck: Supple, no JVD, carotid bruits, or masses. Cardiac: RRR, no murmurs, rubs, or gallops. No clubbing, cyanosis, edema.  Radials/DP/PT 2+ and equal bilaterally.  Respiratory:  Respirations regular and unlabored, clear to auscultation bilaterally. GI: Soft, nontender, nondistended, BS + x 4. MS: no deformity or atrophy. Skin: warm and dry, no rash. Neuro:  Strength and sensation are intact. Psych: Normal affect.  Accessory Clinical Findings    Recent Labs: 08/20/2022: TSH 2.915 08/23/2022: BUN 20; Creatinine, Ser 0.85; Hemoglobin 14.5; Magnesium 2.1; Platelets 444; Potassium 3.4; Sodium 135   Recent Lipid Panel    Component Value Date/Time   CHOL (H) 03/18/2007 0115    224        ATP III CLASSIFICATION:  <200     mg/dL   Desirable  200-239  mg/dL   Borderline High  >=240    mg/dL   High   TRIG 107 03/18/2007 0115   HDL 61 03/18/2007 0115   CHOLHDL 3.7 03/18/2007 0115   VLDL 21 03/18/2007 0115   LDLCALC (H) 03/18/2007  0115    142        Total Cholesterol/HDL:CHD Risk Coronary Heart Disease Risk Table                     Men   Women  1/2 Average Risk   3.4   3.3         ECG personally reviewed by me today-normal sinus rhythm possible left atrial enlargement 77 bpm no ectopy.  Echocardiogram 03/31/07  LEFT VENTRICLE:   -  Left ventricular size was normal.   -  Overall left ventricular systolic function was normal.   -  Left ventricular ejection fraction was estimated , range being 55         % to 65 %.   -  There were no left ventricular regional wall motion         abnormalities.   -  Left ventricular wall thickness was normal.    AORTIC VALVE:   -  The aortic valve was trileaflet.   -  Aortic valve thickness was normal.   -  There was normal aortic valve leaflet excursion.     Doppler interpretation(s):   -  Transaortic velocity was within the normal range.   -  There was no evidence for aortic valve stenosis.   -  There was no significant aortic valvular regurgitation.    AORTA:   -  The aortic root was normal in size.    MITRAL VALVE:   -  There was mild thickening of the mitral valve.     Doppler interpretation(s):   -  There was trivial mitral valvular regurgitation.    LEFT ATRIUM:   -  Left atrial size was normal.    RIGHT VENTRICLE:   -  Right ventricular size was normal.   -  Right ventricular systolic function was normal.    PULMONIC VALVE:   Doppler interpretation(s):   -  There was no significant pulmonic regurgitation.    TRICUSPID VALVE:   Doppler interpretation(s):   -  There was trivial tricuspid valvular regurgitation.    PULMONARY ARTERY:   Doppler interpretation(s):   -  The estimated pulmonary artery systolic pressure was  within the         normal range.    RIGHT ATRIUM:   -  Right atrial size was normal.    PERICARDIUM:   -  There was no pericardial effusion.     ---------------------------------------------------------------    SUMMARY   -   Overall left ventricular systolic function was normal. Left         ventricular ejection fraction was estimated , range being 55         % to 65 %. There were no left ventricular regional wall         motion abnormalities.    Assessment & Plan   1.  Atrial fibrillation, palpitations-heart rate today 77 bpm.  Reports more intense and severe episode with longer duration since being discharged from the hospital.  Reports compliance with apixaban and denies bleeding issues.  Case reviewed with Dr. Ellyn Hack.  Several questions answered about atrial fibrillation management, medication and treatment.  Shared decision making was used and patient would like to stay on diltiazem at this time. Continue  apixaban-denies bleeding issues Continue diltiazem-May take an extra dose for episodes of palpitations Avoid triggers caffeine, chocolate, EtOH, dehydration etc. Order echocardiogram, coronary CTA Refer to A-fib clinic Order 14-day cardiac event monitor Order split-night sleep study-Epworth sleep score 14  Essential hypertension-BP today 118/86.  Well-controlled at home. Continue losartan, HCTZ Heart healthy low-sodium diet Increase physical activity as tolerated  Hypothyroidism-reports compliance with Synthroid.  TSH 2.915 on 08/20/2022 Continue Synthroid at current dose Follows with PCP  GERD-reports increased acid reflux due to discontinuation of Protonix.  Drug interaction between Cardizem and Protonix checked.  No interaction identified. Continue Protonix  Disposition: Follow-up with Dr. Ellyn Hack after testing.  Jossie Ng. Yakira Duquette [name]     08/24/2022, 2:43 PM [place] [address] Office [phone] Fax [phone]    I spent 15 minutes examining this patient, reviewing medications, and using patient centered shared decision making involving her cardiac care.  Prior to her visit I spent greater than 20 minutes reviewing her past medical  history,  medications, and prior cardiac tests.

## 2022-08-23 ENCOUNTER — Emergency Department (HOSPITAL_COMMUNITY): Payer: Managed Care, Other (non HMO)

## 2022-08-23 ENCOUNTER — Emergency Department (HOSPITAL_COMMUNITY)
Admission: EM | Admit: 2022-08-23 | Discharge: 2022-08-23 | Disposition: A | Discharge status: Home / Self Care | Payer: Managed Care, Other (non HMO) | Attending: Emergency Medicine | Admitting: Emergency Medicine

## 2022-08-23 ENCOUNTER — Encounter (HOSPITAL_COMMUNITY): Payer: Self-pay

## 2022-08-23 DIAGNOSIS — E039 Hypothyroidism, unspecified: Secondary | ICD-10-CM | POA: Diagnosis not present

## 2022-08-23 DIAGNOSIS — R209 Unspecified disturbances of skin sensation: Secondary | ICD-10-CM | POA: Diagnosis not present

## 2022-08-23 DIAGNOSIS — I4891 Unspecified atrial fibrillation: Secondary | ICD-10-CM | POA: Insufficient documentation

## 2022-08-23 DIAGNOSIS — R002 Palpitations: Secondary | ICD-10-CM | POA: Diagnosis present

## 2022-08-23 DIAGNOSIS — R42 Dizziness and giddiness: Secondary | ICD-10-CM | POA: Diagnosis not present

## 2022-08-23 DIAGNOSIS — Z7901 Long term (current) use of anticoagulants: Secondary | ICD-10-CM | POA: Diagnosis not present

## 2022-08-23 LAB — CBC WITH DIFFERENTIAL/PLATELET
Abs Immature Granulocytes: 0.04 10*3/uL (ref 0.00–0.07)
Basophils Absolute: 0.2 10*3/uL — ABNORMAL HIGH (ref 0.0–0.1)
Basophils Relative: 2 %
Eosinophils Absolute: 0.3 10*3/uL (ref 0.0–0.5)
Eosinophils Relative: 3 %
HCT: 40.2 % (ref 36.0–46.0)
Hemoglobin: 14.5 g/dL (ref 12.0–15.0)
Immature Granulocytes: 1 %
Lymphocytes Relative: 42 %
Lymphs Abs: 3.5 10*3/uL (ref 0.7–4.0)
MCH: 32.8 pg (ref 26.0–34.0)
MCHC: 36.1 g/dL — ABNORMAL HIGH (ref 30.0–36.0)
MCV: 91 fL (ref 80.0–100.0)
Monocytes Absolute: 0.7 10*3/uL (ref 0.1–1.0)
Monocytes Relative: 8 %
Neutro Abs: 3.7 10*3/uL (ref 1.7–7.7)
Neutrophils Relative %: 44 %
Platelets: 444 10*3/uL — ABNORMAL HIGH (ref 150–400)
RBC: 4.42 MIL/uL (ref 3.87–5.11)
RDW: 12.2 % (ref 11.5–15.5)
WBC: 8.4 10*3/uL (ref 4.0–10.5)
nRBC: 0 % (ref 0.0–0.2)

## 2022-08-23 LAB — TROPONIN I (HIGH SENSITIVITY)
Troponin I (High Sensitivity): 3 ng/L (ref <18)
Troponin I (High Sensitivity): 3 ng/L (ref <18)

## 2022-08-23 LAB — BASIC METABOLIC PANEL
Anion gap: 11 (ref 5–15)
BUN: 20 mg/dL (ref 8–23)
CO2: 22 mmol/L (ref 22–32)
Calcium: 9.3 mg/dL (ref 8.9–10.3)
Chloride: 102 mmol/L (ref 98–111)
Creatinine, Ser: 0.85 mg/dL (ref 0.44–1.00)
GFR, Estimated: >60 mL/min (ref >60)
Glucose, Bld: 123 mg/dL — ABNORMAL HIGH (ref 70–99)
Potassium: 3.4 mmol/L — ABNORMAL LOW (ref 3.5–5.1)
Sodium: 135 mmol/L (ref 135–145)

## 2022-08-23 LAB — MAGNESIUM: Magnesium: 2.1 mg/dL (ref 1.7–2.4)

## 2022-08-23 NOTE — ED Provider Triage Note (Signed)
Emergency Medicine Provider Triage Evaluation Note  [name] , a 66 y.o. female  was evaluated in triage.  Pt complains of AFIB.  Seen recently for same, converted with IV cardizem so started on home cardizem and eliquis. States has had a few episodes over the weekend but short lived.  New episode started around 0445 this morning and has persisted.  States she is starting to feel weak and generally unwell from this, almost like she may pass out.  Denies any chest pain.  Review of Systems  Positive: AFIB Negative: fever  Physical Exam  BP 131/86 (BP Location: Right Arm)   Pulse 75   Temp 98.1 F (36.7 C) (Oral)   Resp 18   SpO2 99%   Gen:   Awake, no distress   Resp:  Normal effort  MSK:   Moves extremities without difficulty  Other:    Medical Decision Making  Medically screening exam initiated at 6:13 AM.  Appropriate orders placed.  [name] was informed that the remainder of the evaluation will be completed by another provider, this initial triage assessment does not replace that evaluation, and the importance of remaining in the ED until their evaluation is complete.  Feeling like AFIB.  Newly diagnosed with same.  EKG NSR in the 70's in triage.  Will check labs, CXR. TSH 3 days ago was WNL.   Larene Pickett, PA-C 08/23/22 [phone]

## 2022-08-23 NOTE — Telephone Encounter (Signed)
I think probably will be can recommend for treatment options are as follows: I would have preferred to follow-up to be in the A-fib clinic since that is always there for her.  However I think we have to start something for maintenance therapy.  -= would start Multaq 400 mg twice daily as a maintenance medication -this will be in lieu of diltiazem --> Would need to check Myoview or preferably Cor CTA to exclude CAD along with Echo -- this will let us know if Class I AAD (like Flecainide) would an option.  If Multaq works - stay on it - if not, would go back to maintenance Diltiazem 120 & PRN Flecainide 100 mg for breakthrough spells (provided Echo & Cor CTA are "OK"   Glenetta Hew, MD

## 2022-08-23 NOTE — ED Provider Notes (Signed)
[facility] Provider Note   CSN: [id number] Arrival date & time: 08/23/22  0606     History  Chief Complaint  Patient presents with   Atrial Fibrillation    [name] is a 66 y.o. female with history of hypothyroidism, hyperlipidemia, GERD, recently diagnosed with atrial fibrillation who presents the emergency department complaining of palpitations and lightheadedness.  States symptoms started around 4:30 AM this morning, she felt as though both her hands were numb/tingling.  She is supposed follow-up with her cardiologist, was recently started on Cardizem and Eliquis, and has been compliant with this.  Symptoms that started this morning have now resolved, and patient feels much better.  Denies any chest pain, shortness of breath.   Atrial Fibrillation       Home Medications Prior to Admission medications   Medication Sig Start Date End Date Taking? Authorizing Provider  allopurinol (ZYLOPRIM) 100 MG tablet Take 200 mg by mouth daily. 04/07/21   [provider]  apixaban (ELIQUIS) 5 MG TABS tablet Take 1 tablet (5 mg total) by mouth 2 (two) times daily. 08/20/22 09/19/22  Henderly, Britni A, PA-C  diltiazem (CARDIZEM CD) 120 MG 24 hr capsule Take 1 capsule (120 mg total) by mouth daily. 08/20/22 09/19/22  Henderly, Britni A, PA-C  losartan-hydrochlorothiazide (HYZAAR) 50-12.5 MG tablet Take 1 tablet by mouth daily.  03/15/16   [provider]  pantoprazole (PROTONIX) 40 MG tablet Take 40 mg by mouth daily. 02/13/20   [provider]  SYNTHROID 137 MCG tablet Take 137 mcg by mouth daily. 01/08/20   [provider]      Allergies    Macrodantin [nitrofurantoin]    Review of Systems   Review of Systems  All other systems reviewed and are negative.   Physical Exam Updated Vital Signs BP 125/73   Pulse 65   Temp 98.1 F (36.7 C) (Oral)   Resp 18   SpO2 99%  Physical Exam Vitals and nursing note  reviewed.  Constitutional:      Appearance: Normal appearance.  HENT:     Head: Normocephalic and atraumatic.  Eyes:     Conjunctiva/sclera: Conjunctivae normal.  Cardiovascular:     Rate and Rhythm: Normal rate and regular rhythm.  Pulmonary:     Effort: Pulmonary effort is normal. No respiratory distress.     Breath sounds: Normal breath sounds.  Abdominal:     General: There is no distension.     Palpations: Abdomen is soft.     Tenderness: There is no abdominal tenderness.  Skin:    General: Skin is warm and dry.  Neurological:     General: No focal deficit present.     Mental Status: She is alert.     ED Results / Procedures / Treatments   Labs (all labs ordered are listed, but only abnormal results are displayed) Labs Reviewed  CBC WITH DIFFERENTIAL/PLATELET - Abnormal; Notable for the following components:      Result Value   MCHC 36.1 (*)    Platelets 444 (*)    Basophils Absolute 0.2 (*)    All other components within normal limits  BASIC METABOLIC PANEL - Abnormal; Notable for the following components:   Potassium 3.4 (*)    Glucose, Bld 123 (*)    All other components within normal limits  MAGNESIUM  TROPONIN I (HIGH SENSITIVITY)  TROPONIN I (HIGH SENSITIVITY)    EKG EKG Interpretation  Date/Time:  Monday August 23 2022  06:08:49 EST Ventricular Rate:  77 PR Interval:  140 QRS Duration: 82 QT Interval:  384 QTC Calculation: 434 R Axis:   67 Text Interpretation: Normal sinus rhythm When compared with ECG of 20-Aug-2022 14:04, No significant change since last tracing Confirmed by Garnette Gunner [phone]) on 08/23/2022 8:24:55 AM  Radiology DG Chest 2 View  Result Date: 08/23/2022 CLINICAL DATA:  Atrial fibrillation. EXAM: CHEST - 2 VIEW COMPARISON:  08/20/2022 FINDINGS: The lungs are clear without focal pneumonia, edema, pneumothorax or pleural effusion. The cardiopericardial silhouette is within normal limits for size. The visualized bony  structures of the thorax are unremarkable. IMPRESSION: No active cardiopulmonary disease. Electronically Signed   By: Misty Stanley M.D.   On: 08/23/2022 06:33    Procedures Procedures    Medications Ordered in ED Medications - No data to display  ED Course/ Medical Decision Making/ A&P                           Medical Decision Making This patient is a 65 y.o. female  who presents to the ED for concern of palpitations and lightheadedness. Recently diagnosed with atrial fibrillation.    Differential diagnoses prior to evaluation: The emergent differential diagnosis includes, but is not limited to,  Cardiac arrhythmias, ACS, CHF, pericarditis, valvular disease, panic/anxiety, ETOH, stimulant use, medication side effect, anemia, hyperthyroidism, pulmonary embolism.  Past Medical History / Co-morbidities: hypothyroidism, hyperlipidemia, GERD, atrial fibrillation  Additional history: Chart reviewed. Pertinent results include: Was seen in ER on 12/1, diagnosed with atrial fibrillation with rapid ventricular rate.  Follows with Dr. Ellyn Hack of cardiology.  Physical Exam: Physical exam performed. The pertinent findings include: Patient no acute distress.  Normal vital signs.  Heart regular rate and rhythm to auscultation.  Lab Tests/Imaging studies: I personally interpreted labs/imaging and the pertinent results include: No leukocytosis, normal hemoglobin.  BMP unremarkable.  Normal magnesium.  Initial troponin 3, delta troponin 3.  Chest x-ray without acute cardiopulmonary abnormalities. I agree with the radiologist interpretation.  Cardiac monitoring: EKG obtained and interpreted by my attending physician which shows: Normal sinus rhythm with a rate of 77 bpm   Disposition: After consideration of the diagnostic results and the patients response to treatment, I feel that emergency department workup does not suggest an emergent condition requiring admission or immediate intervention beyond  what has been performed at this time. The plan is: discharge to home with close cardiology follow up. While patient may have been in atrial fibrillation this morning, this has resolved and she has maintained normal sinus rhythm during her 3 hour observation in the ER. Low concern for ischemia without ischemic changes on EKG and negative serial troponins. The patient is safe for discharge and has been instructed to return immediately for worsening symptoms, change in symptoms or any other concerns.  I discussed this case with my attending physician Dr. Truett Mainland who cosigned this note including patient's presenting symptoms, physical exam, and planned diagnostics and interventions. Attending physician stated agreement with plan or made changes to plan which were implemented.   Final Clinical Impression(s) / ED Diagnoses Final diagnoses:  Palpitations  Lightheadedness    Rx / DC Orders ED Discharge Orders     None      Portions of this report may have been transcribed using voice recognition software. Every effort was made to ensure accuracy; however, inadvertent computerized transcription errors may be present.    Caron Tardif T, PA-C 08/23/22 1019  Cristie Hem, MD 08/26/22 [phone]

## 2022-08-23 NOTE — Discharge Instructions (Signed)
You were seen in the emergency department today for palpitations and lightheadedness.  While you may have been in atrial fibrillation this morning, this has resolved.  You have maintained normal rhythm while in the ER.  We evaluated your cardiac enzymes to look at the demand on your heart, and these were negative.  It is incredibly important you follow-up with your cardiologist to discuss long-term management of your atrial fibrillation.    Continue to monitor how you're doing and return to the ER for new or worsening symptoms.

## 2022-08-23 NOTE — ED Triage Notes (Signed)
Pt states that she is newly diagnosed with afib, sent home with Cardizem and eliquis, woke up tonight with palpations and feeling like she is going to pass out. Pt reports that both hands feel numb as well

## 2022-08-23 NOTE — Telephone Encounter (Signed)
Spoke to patient stated she already has appointment with Coletta Memos NP 12/5 at 2:45 pm.

## 2022-08-24 ENCOUNTER — Ambulatory Visit: Payer: Managed Care, Other (non HMO) | Attending: General Practice

## 2022-08-24 ENCOUNTER — Encounter: Payer: Self-pay | Admitting: General Practice

## 2022-08-24 ENCOUNTER — Ambulatory Visit (INDEPENDENT_AMBULATORY_CARE_PROVIDER_SITE_OTHER): Payer: Managed Care, Other (non HMO) | Admitting: General Practice

## 2022-08-24 VITALS — BP 118/86 | HR 77 | Ht 66.0 in | Wt 203.2 lb

## 2022-08-24 DIAGNOSIS — I480 Paroxysmal atrial fibrillation: Secondary | ICD-10-CM | POA: Diagnosis not present

## 2022-08-24 DIAGNOSIS — I10 Essential (primary) hypertension: Secondary | ICD-10-CM

## 2022-08-24 DIAGNOSIS — E038 Other specified hypothyroidism: Secondary | ICD-10-CM

## 2022-08-24 DIAGNOSIS — E782 Mixed hyperlipidemia: Secondary | ICD-10-CM

## 2022-08-24 DIAGNOSIS — R0609 Other forms of dyspnea: Secondary | ICD-10-CM

## 2022-08-24 DIAGNOSIS — R002 Palpitations: Secondary | ICD-10-CM

## 2022-08-24 DIAGNOSIS — R5383 Other fatigue: Secondary | ICD-10-CM

## 2022-08-24 MED ORDER — DILTIAZEM HCL ER COATED BEADS 120 MG PO CP24: 120.0000 mg | ORAL_CAPSULE | Freq: Every day | ORAL | 0 refills | Status: DC

## 2022-08-24 NOTE — Progress Notes (Unsigned)
Enrolled patient for a 7 day Zio XT monitor to be mailed to patients home  Dr Ellyn Hack to read

## 2022-08-24 NOTE — Patient Instructions (Addendum)
Medication Instructions:  MAY TAKE EXTRA DILTIAZEM '120MG'$  FOR SUSTAINED PALPITATIONS *If you need a refill on your cardiac medications before your next appointment, please call your pharmacy*  Lab Work: NONE If you have labs (blood work) drawn today and your tests are completely normal, you will receive your results only by:  [place] (if you have MyChart) OR A paper copy in the mail If you have any lab test that is abnormal or we need to change your treatment, we will call you to review the results.  Testing/Procedures: Echocardiogram - Your physician has requested that you have an echocardiogram. Echocardiography is a painless test that uses sound waves to create images of your heart. It provides your doctor with information about the size and shape of your heart and how well your heart's chambers and valves are working. This procedure takes approximately one hour. There are no restrictions for this procedure.   Your physician has requested that you have a CORONARY CT-SEE ATTACHED  Follow-Up: At [facility], you and your health needs are our priority.  As part of our continuing mission to provide you with exceptional heart care, we have created designated Provider Care Teams.  These Care Teams include your primary Cardiologist (physician) and Advanced Practice Providers (APPs -  Physician Assistants and Nurse Practitioners) who all work together to provide you with the care you need, when you need it.  We recommend signing up for the patient portal called "MyChart".  Sign up information is provided on this After Visit Summary.  MyChart is used to connect with patients for Virtual Visits (Telemedicine).  Patients are able to view lab/test results, encounter notes, upcoming appointments, etc.  Non-urgent messages can be sent to your provider as well.   To learn more about what you can do with MyChart, go to [email or web address].    Your next appointment:   AFTER TESTING    The format for your next appointment:   In Person  Provider:   Glenetta Hew, MD  or Coletta Memos, FNP       Other Instructions Please try to avoid these triggers: Do not use any products that have nicotine or tobacco in them. These include cigarettes, e-cigarettes, and chewing tobacco. If you need help quitting, ask your doctor. Eat heart-healthy foods. Talk with your doctor about the right eating plan for you. Exercise regularly as told by your doctor. Stay hydrated Do not drink alcohol, Caffeine or chocolate. Lose weight if you are overweight. Do not use drugs, including cannabis   REFERRAL TO AFIB CLINIC  EVENT MONITOR WILL BE SENT TO YOU  SOMEONE WILL BE CALLING YOU TO SCHEDULE SLEEP STUDY  Important Information About Sugar           Your cardiac CT will be scheduled at one of the below locations:   [facility] [address] [place], [place] [address] [phone]  [place] [address] [place],  [address] [phone]  [facility] [place],  [address] [phone]  If scheduled at [facility], please arrive at the [facility] (Entrance C2) of [facility] 30 minutes prior to test start time. You can use the FREE valet parking offered at entrance C (encouraged to control the heart rate for the test)  Proceed to the [facility] Radiology Department (first floor) to check-in and test prep.  All radiology patients and  guests should use entrance C2 at [facility], accessed from [facility], even though the hospital's physical address listed is [address].    If scheduled at [facility] or [facility], please arrive 15 mins early for check-in and test prep.   Please follow these instructions carefully (unless  otherwise directed):  Hold all erectile dysfunction medications at least 3 days (72 hrs) prior to test. (Ie viagra, cialis, sildenafil, tadalafil, etc) We will administer nitroglycerin during this exam.   On the Night Before the Test: Be sure to Drink plenty of water. Do not consume any caffeinated/decaffeinated beverages or chocolate 12 hours prior to your test. Do not take any antihistamines 12 hours prior to your test.  On the Day of the Test: Drink plenty of water until 1 hour prior to the test. Do not eat any food 1 hour prior to test. You may take your regular medications prior to the test.  Take metoprolol (Lopressor) two hours prior to test.     Metoprolol tartrate 100 mg PO 90-120 minutes prior to scan    HOLD Furosemide/Hydrochlorothiazide morning of the test. FEMALES- please wear underwire-free bra if available, avoid dresses & tight clothing      After the Test: Drink plenty of water. After receiving IV contrast, you may experience a mild flushed feeling. This is normal. On occasion, you may experience a mild rash up to 24 hours after the test. This is not dangerous. If this occurs, you can take Benadryl 25 mg and increase your fluid intake. If you experience trouble breathing, this can be serious. If it is severe call 911 IMMEDIATELY. If it is mild, please call our office. If you take any of these medications: Glipizide/Metformin, Avandament, Glucavance, please do not take 48 hours after completing test unless otherwise instructed.  We will call to schedule your test 2-4 weeks out understanding that some insurance companies will need an authorization prior to the service being performed.   For non-scheduling related questions, please contact the cardiac imaging nurse navigator should you have any questions/concerns: Marchia Bond, Cardiac Imaging Nurse Navigator Gordy Clement, Cardiac Imaging Nurse Navigator [facility] Direct Office Dial:  [phone]   For scheduling needs, including cancellations and rescheduling, please call [place], [phone].

## 2022-08-24 NOTE — Telephone Encounter (Signed)
Patient has appointment with Thomasene Mohair today.

## 2022-08-25 ENCOUNTER — Ambulatory Visit (INDEPENDENT_AMBULATORY_CARE_PROVIDER_SITE_OTHER): Payer: 59 | Admitting: Psychology

## 2022-08-25 ENCOUNTER — Telehealth: Payer: Self-pay

## 2022-08-25 DIAGNOSIS — F4310 Post-traumatic stress disorder, unspecified: Secondary | ICD-10-CM | POA: Diagnosis not present

## 2022-08-25 NOTE — Telephone Encounter (Signed)
Called [name] they said that she does not need a prior authorization for the diltiazem she was just trying to refill it too soon.  Did the prior authorization for the Eliquis.she was approved from 08-25-22 to 08-25-2023 case ID [id number] he copay is going to be $103 for 30 days and 60 pills. I have left a message for pt to discuss coverage.

## 2022-08-25 NOTE — Telephone Encounter (Signed)
Spoke with pt regarding issues with getting her eliquis. Pt states that insurance is saying that her prescription will be over $600 to get her medication. Pt was told that she needs a prior auth sent in. Will place 30 day free card and samples at the front desk for her to pick up while we obtain prior auth. Pt verbalizes understanding.   Medication samples have been provided to the patient.  Drug name: Eliquis '5mg'$   Qty: 3 boxes LOT: [id number] Exp.Date: June 2025  Samples left at front desk for patient pick-up. Patient notified.

## 2022-08-26 ENCOUNTER — Other Ambulatory Visit: Payer: Self-pay | Admitting: General Practice

## 2022-08-26 ENCOUNTER — Ambulatory Visit: Payer: Managed Care, Other (non HMO)

## 2022-08-26 ENCOUNTER — Other Ambulatory Visit: Payer: Self-pay | Admitting: Cardiology

## 2022-08-26 DIAGNOSIS — E782 Mixed hyperlipidemia: Secondary | ICD-10-CM

## 2022-08-26 NOTE — Telephone Encounter (Signed)
Patient is not on Medicare plan, please be sure she gets commercial co-pay card to help with cost.

## 2022-08-26 NOTE — Progress Notes (Signed)
[facility] Counselor/Therapist Progress Note  Patient ID: [name], MRN: [id number],    Date: 08/25/2022  Time Spent: 60 minutes  Treatment Type: Individual Therapy  Reported Symptoms: sadness, feelings of helplessness, anxiety and anger  Mental Status Exam: Appearance:  Casual     Behavior: Appropriate  Motor: Normal  Speech/Language:  Clear and Coherent  Affect: Blunt  Mood: depressed  Thought process: normal  Thought content:   WNL  Sensory/Perceptual disturbances:   WNL  Orientation: oriented to person, place, time/date, and situation  Attention: Good  Concentration: Good  Memory: WNL  Fund of knowledge:  Good  Insight:   Good  Judgment:  Good  Impulse Control: Good   Risk Assessment: Danger to Self:  No Self-injurious Behavior: No Danger to Others: No Duty to Warn:no Physical Aggression / Violence:No  Access to Firearms a concern: No  Gang Involvement:No   Subjective: The patient attended a face-to-face individual therapy session in the office today.  The patient presents with a blunted affect and mood is pleasant.  The patient reports that she got a diagnosis of A-fib and has been dealing with that over the last few weeks.  We talked about how she is handling that.  I recommended that she read the book, Buddha's Brain and work on getting in a regular routine of meditation and mindfulness.  We talked also talked about other tools to use in order to learn how to do that.  The patient has a tendency to go full force all the time and does not stop in order to be able to calm herself or settle herself down.  We talked about her type A personality being part of the reason that she struggles with A-fib possibly.  She also reports that her mother recently just got into hospice care and we talked about owning her feelings and being aware of how she is going to need to move through these.  Interventions: Cognitive Behavioral Therapy, Insight-Oriented,  Family Systems, and Interpersonal  Diagnosis:PTSD (post-traumatic stress disorder)  Plan:Plan of Care: Treatment Plan Client Abilities/Strengths  Intelligent, motivated, insightful  Client Treatment Preferences  Outpatient Individual therapy  Client Statement of Needs  "Dr. Casimiro Needle sent me to you because of a trauma I suffered on vacation" Treatment Level  Outpatient Individual therapy  Symptoms  Excessive and/or unrealistic worry that is difficult to control occurring more days than not for at least 6  months about a number of events or activities.: (Status: maintained). Has been  exposed to a traumatic event involving actual or perceived threat of death or serious injury.: (Status: maintained). Hypervigilance (e.g., feeling constantly on edge,  experiencing concentration difficulties, having trouble falling or staying asleep, exhibiting a general  state of irritability).:  (Status: maintained). Impairment in social, occupational,  or other areas of functioning.: (Status: maintained).  Problems Addressed  Posttraumatic Stress Disorder (PTSD),  Goals 1. Eliminate or reduce the negative impact trauma related symptoms have  on social, occupational, and family functioning. Objective Participate in Eye Movement Desensitization and Reprocessing (EMDR) to reduce emotional distress  related to traumatic thoughts, feelings, and images. Target Date: 07/07/2023 Frequency: Weekly  Progress: 0 Modality: individual Related Interventions 1. Utilize Eye Movement Desensitization and Reprocessing (EMDR) to reduce the client's  emotional reactivity to the traumatic event and reduce PTSD symptoms. 2. Stabilize anxiety level while increasing ability to function on a daily  basis. Objective Learn and implement problem-solving strategies for realistically addressing worries. Target Date: 07/07/2023 Frequency: Weekly  Progress: 0 Modality: individual Related Interventions 1. Teach the client  problem-solving strategies involving specifically defining a problem,  generating options for addressing it, evaluating the pros and cons of each option, selecting and  implementing an optional action, and reevaluating and refining the action (or assign "Applying  Problem-Solving to Interpersonal Conflict" in the Adult Psychotherapy Homework Planner by  Bryn Gulling) F43.10 (Posttraumatic stress disorder) - Open - [Signifier: n/a] Posttraumatic Stress  Disorder  Conditions For Discharge Achievement of treatment goals and objectives   The patient has approved this plan   Yvetta Drotar G [place], LCSW

## 2022-08-27 ENCOUNTER — Ambulatory Visit
Admission: RE | Admit: 2022-08-27 | Discharge: 2022-08-27 | Disposition: A | Discharge status: Home / Self Care | Payer: No Typology Code available for payment source | Source: Ambulatory Visit | Attending: General Practice | Admitting: General Practice

## 2022-08-27 DIAGNOSIS — E782 Mixed hyperlipidemia: Secondary | ICD-10-CM

## 2022-08-27 NOTE — Telephone Encounter (Signed)
Pt notified, she will pick up and use.

## 2022-08-27 NOTE — Telephone Encounter (Signed)
I already spoke with pt this morning she will put on her monitor after the ECHO testing.

## 2022-09-01 ENCOUNTER — Other Ambulatory Visit: Payer: Self-pay | Admitting: *Deleted

## 2022-09-01 DIAGNOSIS — R002 Palpitations: Secondary | ICD-10-CM

## 2022-09-01 DIAGNOSIS — I4891 Unspecified atrial fibrillation: Secondary | ICD-10-CM

## 2022-09-01 DIAGNOSIS — I480 Paroxysmal atrial fibrillation: Secondary | ICD-10-CM | POA: Diagnosis not present

## 2022-09-01 MED ORDER — APIXABAN 5 MG PO TABS: 5.0000 mg | ORAL_TABLET | Freq: Two times a day (BID) | ORAL | 1 refills | Status: DC

## 2022-09-01 NOTE — Telephone Encounter (Signed)
Eliquis '5mg'$  refill request received. Patient is 66 years old, weight-92.2kg, Crea-0.85 on 08/23/2022, Diagnosis-Afib, and last seen by Coletta Memos on 08/24/2022. Dose is appropriate based on dosing criteria. Will send in refill to requested pharmacy.

## 2022-09-02 ENCOUNTER — Ambulatory Visit (HOSPITAL_COMMUNITY): Payer: Managed Care, Other (non HMO)

## 2022-09-02 ENCOUNTER — Ambulatory Visit: Payer: 59 | Admitting: Psychology

## 2022-09-09 ENCOUNTER — Ambulatory Visit (HOSPITAL_COMMUNITY): Payer: Managed Care, Other (non HMO) | Attending: Cardiovascular Disease

## 2022-09-09 DIAGNOSIS — I517 Cardiomegaly: Secondary | ICD-10-CM

## 2022-09-09 DIAGNOSIS — I081 Rheumatic disorders of both mitral and tricuspid valves: Secondary | ICD-10-CM

## 2022-09-09 DIAGNOSIS — I480 Paroxysmal atrial fibrillation: Secondary | ICD-10-CM | POA: Diagnosis present

## 2022-09-09 LAB — ECHOCARDIOGRAM COMPLETE
Area-P 1/2: 3.31 cm2
S' Lateral: 2.9 cm

## 2022-09-10 ENCOUNTER — Other Ambulatory Visit: Payer: Managed Care, Other (non HMO)

## 2022-09-12 NOTE — Telephone Encounter (Signed)
Yes she should be fine exercising.  Glenetta Hew, MD

## 2022-09-15 ENCOUNTER — Ambulatory Visit (HOSPITAL_COMMUNITY)
Admission: RE | Admit: 2022-09-15 | Discharge: 2022-09-15 | Disposition: A | Discharge status: Home / Self Care | Payer: Managed Care, Other (non HMO) | Source: Ambulatory Visit | Attending: Nurse Practitioner | Admitting: Nurse Practitioner

## 2022-09-15 ENCOUNTER — Ambulatory Visit
Admission: RE | Admit: 2022-09-15 | Discharge: 2022-09-15 | Disposition: A | Discharge status: Home / Self Care | Payer: Managed Care, Other (non HMO) | Source: Ambulatory Visit | Attending: Neurosurgery | Admitting: Neurosurgery

## 2022-09-15 VITALS — BP 136/82 | HR 77 | Ht 66.0 in | Wt 200.2 lb

## 2022-09-15 DIAGNOSIS — Z79899 Other long term (current) drug therapy: Secondary | ICD-10-CM | POA: Insufficient documentation

## 2022-09-15 DIAGNOSIS — Z7901 Long term (current) use of anticoagulants: Secondary | ICD-10-CM | POA: Diagnosis not present

## 2022-09-15 DIAGNOSIS — Z8249 Family history of ischemic heart disease and other diseases of the circulatory system: Secondary | ICD-10-CM | POA: Insufficient documentation

## 2022-09-15 DIAGNOSIS — Z87891 Personal history of nicotine dependence: Secondary | ICD-10-CM | POA: Diagnosis not present

## 2022-09-15 DIAGNOSIS — Z8349 Family history of other endocrine, nutritional and metabolic diseases: Secondary | ICD-10-CM | POA: Diagnosis not present

## 2022-09-15 DIAGNOSIS — I10 Essential (primary) hypertension: Secondary | ICD-10-CM | POA: Diagnosis not present

## 2022-09-15 DIAGNOSIS — K219 Gastro-esophageal reflux disease without esophagitis: Secondary | ICD-10-CM | POA: Insufficient documentation

## 2022-09-15 DIAGNOSIS — I4891 Unspecified atrial fibrillation: Secondary | ICD-10-CM | POA: Insufficient documentation

## 2022-09-15 DIAGNOSIS — E039 Hypothyroidism, unspecified: Secondary | ICD-10-CM | POA: Diagnosis not present

## 2022-09-15 DIAGNOSIS — D329 Benign neoplasm of meninges, unspecified: Secondary | ICD-10-CM

## 2022-09-15 DIAGNOSIS — I480 Paroxysmal atrial fibrillation: Secondary | ICD-10-CM

## 2022-09-15 DIAGNOSIS — D6869 Other thrombophilia: Secondary | ICD-10-CM

## 2022-09-15 LAB — CBC
HCT: 41.7 % (ref 36.0–46.0)
Hemoglobin: 14 g/dL (ref 12.0–15.0)
MCH: 31.6 pg (ref 26.0–34.0)
MCHC: 33.6 g/dL (ref 30.0–36.0)
MCV: 94.1 fL (ref 80.0–100.0)
Platelets: 467 10*3/uL — ABNORMAL HIGH (ref 150–400)
RBC: 4.43 MIL/uL (ref 3.87–5.11)
RDW: 12.5 % (ref 11.5–15.5)
WBC: 8.1 10*3/uL (ref 4.0–10.5)
nRBC: 0 % (ref 0.0–0.2)

## 2022-09-15 LAB — BASIC METABOLIC PANEL
Anion gap: 7 (ref 5–15)
BUN: 13 mg/dL (ref 8–23)
CO2: 28 mmol/L (ref 22–32)
Calcium: 9.5 mg/dL (ref 8.9–10.3)
Chloride: 107 mmol/L (ref 98–111)
Creatinine, Ser: 0.79 mg/dL (ref 0.44–1.00)
GFR, Estimated: >60 mL/min (ref >60)
Glucose, Bld: 111 mg/dL — ABNORMAL HIGH (ref 70–99)
Potassium: 4.1 mmol/L (ref 3.5–5.1)
Sodium: 142 mmol/L (ref 135–145)

## 2022-09-15 MED ORDER — DILTIAZEM HCL 30 MG PO TABS: ORAL_TABLET | ORAL | 1 refills | Status: DC

## 2022-09-15 MED ORDER — GADOPICLENOL 0.5 MMOL/ML IV SOLN
9.0000 mL | Freq: Once | INTRAVENOUS | Status: AC | PRN
  Administered 2022-09-15: 9 mL via INTRAVENOUS

## 2022-09-15 NOTE — Progress Notes (Signed)
Primary Care Physician: Shon Baton, MD Referring Physician:Dr. Sherrye Payor is a 66 y.o. female with a h/o SVT,  GERD, Hypothyroid that had recent new onset of afib presenting to the ED 12/1 and 12/4  symptomatic with RVR.  She received IV Cardizem and her heart rate decreased. She was noted to still be in atrial fibrillation. She was offered DCCV but was hesitant. She was also offered admission. She was reassessed at 10:20 AM. Her heart rate was noted to be in the 120s and she continued to be in atrial fibrillation. Etiology of her atrial fibrillation was unclear. Plan was made for admission for echocardiogram as well as further workup for atrial fibrillation. She was started on p.o. diltiazem and apixaban 5 mg twice daily. Her CBC showed slightly elevated platelets 426. Her chest x-ray showed no acute abnormalities. Her BMP showed slightly elevated glucose at 133.   She had f/u with cardiology, Coletta Memos, NP, 08/24/22. They discussed  use of Multaq, ordered  echo and cardiac CT which showed 0 calcium score. Echo with normal EF and no significant valvular disease. She was asked to f/u in the afib clinic.  Today, she states no further afib. She had many questions re etiology and treatment. She states her husband just had an ablation a short time ago. She is wanting to approach as conservative as possible for now. She is laso pending a sleep study. Some snoring no significant apnea. No alcohol, tobacco use, minimal caffeine. She has started a regular exercise routine and changed her diet and has lost several pounds.   Today, she denies symptoms of palpitations, chest pain, shortness of breath, orthopnea, PND, lower extremity edema, dizziness, presyncope, syncope, or neurologic sequela. The patient is tolerating medications without difficulties and is otherwise without complaint today.   Past Medical History:  Diagnosis Date   GERD (gastroesophageal reflux disease)    Hypothyroid     Acquired.  Has had radiofrequency ablation   Past Surgical History:  Procedure Laterality Date   TONSILLECTOMY  1962   UPPER GASTROINTESTINAL ENDOSCOPY     WRIST FRACTURE SURGERY  2002   right wrist    Current Outpatient Medications  Medication Sig Dispense Refill   allopurinol (ZYLOPRIM) 100 MG tablet Take 100 mg by mouth daily.     apixaban (ELIQUIS) 5 MG TABS tablet Take 1 tablet (5 mg total) by mouth 2 (two) times daily. 180 tablet 1   diltiazem (CARDIZEM CD) 120 MG 24 hr capsule Take 1 capsule (120 mg total) by mouth daily. May take extra tab for sustained palpitations 30 capsule 0   diltiazem (CARDIZEM) 30 MG tablet Take 1 tablet every 4 hours AS NEEDED for AFIB heart rate >100 as long as top BP >100. 30 tablet 1   losartan-hydrochlorothiazide (HYZAAR) 50-12.5 MG tablet Take 1 tablet by mouth daily.      SYNTHROID 137 MCG tablet Take 137 mcg by mouth daily.     pantoprazole (PROTONIX) 40 MG tablet Take 40 mg by mouth daily. (Patient not taking: Reported on 09/15/2022)     No current facility-administered medications for this encounter.    Allergies  Allergen Reactions   Macrodantin [Nitrofurantoin] Hives    Shortness of breath    Social History   Socioeconomic History   Marital status: Married    Spouse name: Not on file   Number of children: 1   Years of education: Not on file   Highest education level: Bachelor's degree (  e.g., BA, AB, BS)  Occupational History    Employer: [employer]    Comment: Office manager  Tobacco Use   Smoking status: Never   Smokeless tobacco: Never  Vaping Use   Vaping Use: Never used  Substance and Sexual Activity   Alcohol use: No    Alcohol/week: 0.0 standard drinks of alcohol   Drug use: No   Sexual activity: Not on file  Other Topics Concern   Not on file  Social History Narrative   Married with 1 child.  No grandchildren.  Lives with her spouse.   She is quite active.  Quit smoking in 2003.   Walks 3  miles a day 7 days a week for about an hour 50 minutes a day   Social Determinants of Health   Financial Resource Strain: Not on file  Food Insecurity: Not on file  Transportation Needs: Not on file  Physical Activity: Not on file  Stress: Not on file  Social Connections: Not on file  Intimate Partner Violence: Not on file    Family History  Problem Relation Age of Onset   Thyroid disease Mother    Hypertension Mother    Thyroid disease Sister    High Cholesterol Sister    Cancer Father    High Cholesterol Father    Hypertension Brother    High Cholesterol Brother    Breast cancer Maternal Aunt 9   Breast cancer Paternal Grandmother 12   Colon cancer Neg Hx     ROS- All systems are reviewed and negative except as per the HPI above  Physical Exam: Vitals:   09/15/22 1317  BP: 136/82  Pulse: 77  Weight: 90.8 kg  Height: '5\' 6"'$  (1.676 m)   Wt Readings from Last 3 Encounters:  09/15/22 90.8 kg  08/24/22 92.2 kg  08/20/22 95.3 kg    Labs: Lab Results  Component Value Date   NA 142 09/15/2022   K 4.1 09/15/2022   CL 107 09/15/2022   CO2 28 09/15/2022   GLUCOSE 111 (H) 09/15/2022   BUN 13 09/15/2022   CREATININE 0.79 09/15/2022   CALCIUM 9.5 09/15/2022   MG 2.1 08/23/2022   Lab Results  Component Value Date   INR 1.02 05/01/2018   Lab Results  Component Value Date   CHOL (H) 03/18/2007    224        ATP III CLASSIFICATION:  <200     mg/dL   Desirable  200-239  mg/dL   Borderline High  >=240    mg/dL   High   HDL 61 03/18/2007   LDLCALC (H) 03/18/2007    142        Total Cholesterol/HDL:CHD Risk Coronary Heart Disease Risk Table                     Men   Women  1/2 Average Risk   3.4   3.3   TRIG 107 03/18/2007     GEN- The patient is well appearing, alert and oriented x 3 today.   Head- normocephalic, atraumatic Eyes-  Sclera clear, conjunctiva pink Ears- hearing intact Oropharynx- clear Neck- supple, no JVP Lymph- no cervical  lymphadenopathy Lungs- Clear to ausculation bilaterally, normal work of breathing Heart- Regular rate and rhythm, no murmurs, rubs or gallops, PMI not laterally displaced GI- soft, NT, ND, + BS Extremities- no clubbing, cyanosis, or edema MS- no significant deformity or atrophy Skin- no rash or lesion Psych- euthymic mood, full  affect Neuro- strength and sensation are intact  EKG-Vent. rate 77 BPM PR interval 142 ms QRS duration 80 ms QT/QTcB 370/418 ms P-R-T axes 54 39 54 Normal sinus rhythm Normal ECG Confirmed by Lars Mage [phone]) on 09/15/2022 9:33:51 PM  Calcium score-IMPRESSION: 1. Coronary calcium score is 0. 2. Stable small pulmonary nodules.  Echo- 1. Left ventricular ejection fraction, by estimation, is 65 to 70%. The  left ventricle has normal function. The left ventricle has no regional  wall motion abnormalities. Left ventricular diastolic parameters were  normal. The average left ventricular  global longitudinal strain is -21.7 %. The global longitudinal strain is  normal.   2. Right ventricular systolic function is normal. The right ventricular  size is normal. There is normal pulmonary artery systolic pressure.   3. Left atrial size was mildly dilated.   4. The mitral valve is normal in structure. Mild mitral valve  regurgitation. No evidence of mitral stenosis.   5. The aortic valve is tricuspid. Aortic valve regurgitation is not  visualized. No aortic stenosis is present.   6. The inferior vena cava is normal in size with greater than 50%  respiratory variability, suggesting right atrial pressure of 3 mmHg   Assessment and Plan:  1. New onset afib General education and triggers discussed re afi We discussed use of Multaq,or front line ablation but she wants to continue to treat this  conservatively for now unless increased afib burden  Continue cardizem 120 mg daily She has noted some muscle aches, wondering if this may be the Cardizem but is  working out more than in the past and unsure etiology of discomfort  If it does not resolve, can try changing out CCB for toprol xl 25 mg at hs Cardizem 30 mg for prn use Rx'ed with instructions for use  Sleep study pending  2 week zio patch mailed in yesterday   2. CHA2DS2VASc  score of at least 3 Continue eliquis 5 mg bid  Precautions of use discussed   3. HTN Stable   For now pt will continue with current therapy   F/u with Coletta Memos as scheduled 1/3 Afib clinic as needed   Butch Penny C. Betsi Crespi, [facility] [address] [place], [place] [address] [phone]

## 2022-09-16 ENCOUNTER — Encounter (HOSPITAL_COMMUNITY): Payer: Self-pay | Admitting: Nurse Practitioner

## 2022-09-16 ENCOUNTER — Other Ambulatory Visit (HOSPITAL_COMMUNITY): Payer: Self-pay

## 2022-09-16 ENCOUNTER — Other Ambulatory Visit: Payer: Self-pay | Admitting: *Deleted

## 2022-09-16 MED ORDER — DILTIAZEM HCL ER COATED BEADS 120 MG PO CP24: 120.0000 mg | ORAL_CAPSULE | Freq: Every day | ORAL | 1 refills | Status: DC

## 2022-09-16 MED ORDER — DILTIAZEM HCL 30 MG PO TABS: ORAL_TABLET | ORAL | 1 refills | Status: DC

## 2022-09-23 ENCOUNTER — Ambulatory Visit: Payer: 59 | Admitting: Psychology

## 2022-09-23 NOTE — Progress Notes (Addendum)
Cardiology Clinic Note   Patient Name: [name] Date of Encounter: 09/24/2022  Primary Care Provider:  Shon Baton, MD Primary Cardiologist:  Glenetta Hew, MD  Patient Profile    [name] 67 year old female presents the clinic today for follow-up evaluation of her atrial fibrillation with RVR.  Past Medical History    Past Medical History:  Diagnosis Date   GERD (gastroesophageal reflux disease)    Hypothyroid    Acquired.  Has had radiofrequency ablation   Past Surgical History:  Procedure Laterality Date   TONSILLECTOMY  1962   UPPER GASTROINTESTINAL ENDOSCOPY     WRIST FRACTURE SURGERY  2002   right wrist    Allergies  Allergies  Allergen Reactions   Macrodantin [Nitrofurantoin] Hives    Shortness of breath    History of Present Illness    [name] is a PMH of hypothyroidism with previous history of SVT, GERD and paroxysmal atrial fibrillation with RVR.  She reported that around 3 AM she woke up and her heart was racing.  She checked her pulse rate via monitor at home and reported a heart rate in the 220s.  She took her home metoprolol and presented to the emergency department.  She noted nausea but did not vomit.  She reported diarrhea.  She noted mild chest tightness and shortness of breath.  Her main complaint was palpitations.  She received IV Cardizem and her heart rate decreased.  She was noted to still be in atrial fibrillation.  She was offered DCCV but was hesitant.  She was also offered admission.  She was reassessed at 10:20 AM.  Her heart rate was noted to be in the 120s and she continued to be in atrial fibrillation.  Etiology of her atrial fibrillation was unclear.  Plan was made for admission for echocardiogram as well as further workup for atrial fibrillation.  She was started on p.o. diltiazem and apixaban 5 mg twice daily.  Her CBC showed slightly elevated platelets 426.  Her chest x-ray showed no acute abnormalities.  Her BMP  showed slightly elevated glucose at 133.  Cardiology followed up with patient and she was noted to be in sinus rhythm.  Outpatient evaluation with cardiac event monitor was planned.  She was discharged in stable condition.  She presented to the clinic 08/24/22 for follow-up evaluation and stated she had a more severe episode of symptoms similar to when she presented to the hospital that were shorter in duration.  Dr. Ellyn Hack recommended Multaq therapy.  She had several questions about atrial fibrillation, antiarrhythmic medication, and treatment.  We used shared decision making to review need for echocardiogram, coronary CTA, and sleep evaluation as well as cardiac event monitor.  She presented with her husband.  She wished to continue diltiazem at the time and wait on results from cardiac event monitor.  She works at [employer].  I ordered echocardiogram, coronary CTA, split-night sleep study, 14-day cardiac event monitor, and continued her diltiazem.  I refered to atrial fibrillation clinic and asked her to avoid triggers for palpitations.  She expressed understanding.  We reviewed importance of continued apixaban therapy.  We will plan follow-up for after coronary CTA with Dr. Ellyn Hack.  Coronary CTA showed a coronary calcium score of 0.  Echocardiogram showed normal EF and no significant valvular abnormalities.  She followed up with [name], [name] 09/15/2022 in the atrial fibrillation clinic.  During that time she reported no further episodes of atrial fibrillation.  She chose conservative management and was pending sleep study.  She has started a regular exercise routine, changed her diet, and was losing weight.  She denied palpitations, chest pain and shortness of breath.  She presents to the clinic today for follow-up evaluation and states she has not felt any more episodes of atrial fibrillation.  We reviewed her cardiac event monitor and her triggered events.  We reviewed her  echocardiogram.  We reviewed her coronary CT.  She reports that her split-night sleep study was not covered by her insurance.  I will order home sleep study.  Her Epworth sleep score was 14.  She may increase her physical activity as tolerated.  She reports compliance with her medications.  Will plan follow-up in 6 months.  Today she denies chest pain, shortness of breath, lower extremity edema, fatigue,  melena, hematuria, hemoptysis, diaphoresis, weakness, presyncope, syncope, orthopnea, and PND.   Home Medications    Prior to Admission medications   Medication Sig Start Date End Date Taking? Authorizing Provider  allopurinol (ZYLOPRIM) 100 MG tablet Take 200 mg by mouth daily. 04/07/21   [provider]  apixaban (ELIQUIS) 5 MG TABS tablet Take 1 tablet (5 mg total) by mouth 2 (two) times daily. 08/20/22 09/19/22  Henderly, Britni A, PA-C  diltiazem (CARDIZEM CD) 120 MG 24 hr capsule Take 1 capsule (120 mg total) by mouth daily. 08/20/22 09/19/22  Henderly, Britni A, PA-C  losartan-hydrochlorothiazide (HYZAAR) 50-12.5 MG tablet Take 1 tablet by mouth daily.  03/15/16   [provider]  pantoprazole (PROTONIX) 40 MG tablet Take 40 mg by mouth daily. 02/13/20   [provider]  SYNTHROID 137 MCG tablet Take 137 mcg by mouth daily. 01/08/20   [provider]    Family History    Family History  Problem Relation Age of Onset   Thyroid disease Mother    Hypertension Mother    Thyroid disease Sister    High Cholesterol Sister    Cancer Father    High Cholesterol Father    Hypertension Brother    High Cholesterol Brother    Breast cancer Maternal Aunt 25   Breast cancer Paternal Grandmother 48   Colon cancer Neg Hx    She indicated that her mother is alive. She indicated that her father is deceased. She indicated that her sister is alive. She indicated that both of her brothers are alive. She indicated that the status of her paternal grandmother is unknown.  She indicated that the status of her maternal aunt is unknown. She indicated that the status of her neg hx is unknown.  Social History    Social History   Socioeconomic History   Marital status: Married    Spouse name: Not on file   Number of children: 1   Years of education: Not on file   Highest education level: Bachelor's degree (e.g., BA, AB, BS)  Occupational History    Employer:  [employer]    Comment: Office manager  Tobacco Use   Smoking status: Never   Smokeless tobacco: Never  Vaping Use   Vaping Use: Never used  Substance and Sexual Activity   Alcohol use: No    Alcohol/week: 0.0 standard drinks of alcohol   Drug use: No   Sexual activity: Not on file  Other Topics Concern   Not on file  Social History Narrative   Married with 1 child.  No grandchildren.  Lives with her spouse.   She is quite active.  Quit smoking in 2003.   Walks 3 miles a day 7 days a week for about an hour 50 minutes a day   Social Determinants of Radio broadcast assistant Strain: Not on file  Food Insecurity: Not on file  Transportation Needs: Not on file  Physical Activity: Not on file  Stress: Not on file  Social Connections: Not on file  Intimate Partner Violence: Not on file     Review of Systems    General:  No chills, fever, night sweats or weight changes.  Cardiovascular:  No chest pain, dyspnea on exertion, edema, orthopnea, palpitations, paroxysmal nocturnal dyspnea. Dermatological: No rash, lesions/masses Respiratory: No cough, dyspnea Urologic: No hematuria, dysuria Abdominal:   No nausea, vomiting, diarrhea, bright red blood per rectum, melena, or hematemesis Neurologic:  No visual changes, wkns, changes in mental status. All other systems reviewed and are otherwise negative except as noted above.  Physical Exam    VS:  BP 138/82   Pulse 68   Ht '5\' 6"'$  (1.676 m)   Wt 200 lb 12.8 oz (91.1 kg)   SpO2 100%   BMI 32.41 kg/m  , BMI Body mass index is  32.41 kg/m. GEN: Well nourished, well developed, in no acute distress. HEENT: normal. Neck: Supple, no JVD, carotid bruits, or masses. Cardiac: RRR, no murmurs, rubs, or gallops. No clubbing, cyanosis, edema.  Radials/DP/PT 2+ and equal bilaterally.  Respiratory:  Respirations regular and unlabored, clear to auscultation bilaterally. GI: Soft, nontender, nondistended, BS + x 4. MS: no deformity or atrophy. Skin: warm and dry, no rash. Neuro:  Strength and sensation are intact. Psych: Normal affect.  Accessory Clinical Findings    Recent Labs: 08/20/2022: TSH 2.915 08/23/2022: Magnesium 2.1 09/15/2022: BUN 13; Creatinine, Ser 0.79; Hemoglobin 14.0; Platelets 467; Potassium 4.1; Sodium 142   Recent Lipid Panel    Component Value Date/Time   CHOL (H) 03/18/2007 0115    224        ATP III CLASSIFICATION:  <200     mg/dL   Desirable  200-239  mg/dL   Borderline High  >=240    mg/dL   High   TRIG 107 03/18/2007 0115   HDL 61 03/18/2007 0115   CHOLHDL 3.7 03/18/2007 0115   VLDL 21 03/18/2007 0115   LDLCALC (H) 03/18/2007 0115    142        Total Cholesterol/HDL:CHD Risk Coronary Heart Disease Risk Table                     Men   Women  1/2 Average Risk   3.4   3.3         ECG personally reviewed by me today-none today.  EKG 08/24/2022 normal sinus rhythm possible left atrial enlargement 77 bpm no ectopy.  Echocardiogram 03/31/07  LEFT VENTRICLE:   -  Left ventricular size was normal.   -  Overall left ventricular systolic function was normal.   -  Left ventricular ejection fraction was estimated , range being 55         % to 65 %.   -  There were no left ventricular regional wall motion         abnormalities.   -  Left ventricular wall thickness was normal.    AORTIC VALVE:   -  The aortic valve was trileaflet.   -  Aortic valve thickness was normal.   -  There was normal aortic valve leaflet excursion.  Doppler interpretation(s):   -  Transaortic velocity  was within the normal range.   -  There was no evidence for aortic valve stenosis.   -  There was no significant aortic valvular regurgitation.    AORTA:   -  The aortic root was normal in size.    MITRAL VALVE:   -  There was mild thickening of the mitral valve.     Doppler interpretation(s):   -  There was trivial mitral valvular regurgitation.    LEFT ATRIUM:   -  Left atrial size was normal.    RIGHT VENTRICLE:   -  Right ventricular size was normal.   -  Right ventricular systolic function was normal.    PULMONIC VALVE:   Doppler interpretation(s):   -  There was no significant pulmonic regurgitation.    TRICUSPID VALVE:   Doppler interpretation(s):   -  There was trivial tricuspid valvular regurgitation.    PULMONARY ARTERY:   Doppler interpretation(s):   -  The estimated pulmonary artery systolic pressure was within the         normal range.    RIGHT ATRIUM:   -  Right atrial size was normal.    PERICARDIUM:   -  There was no pericardial effusion.     ---------------------------------------------------------------    SUMMARY   -  Overall left ventricular systolic function was normal. Left         ventricular ejection fraction was estimated , range being 55         % to 65 %. There were no left ventricular regional wall         motion abnormalities.    Echocardiogram 09/09/2022  IMPRESSIONS     1. Left ventricular ejection fraction, by estimation, is 65 to 70%. The  left ventricle has normal function. The left ventricle has no regional  wall motion abnormalities. Left ventricular diastolic parameters were  normal. The average left ventricular  global longitudinal strain is -21.7 %. The global longitudinal strain is  normal.   2. Right ventricular systolic function is normal. The right ventricular  size is normal. There is normal pulmonary artery systolic pressure.   3. Left atrial size was mildly dilated.   4. The mitral valve is normal in  structure. Mild mitral valve  regurgitation. No evidence of mitral stenosis.   5. The aortic valve is tricuspid. Aortic valve regurgitation is not  visualized. No aortic stenosis is present.   6. The inferior vena cava is normal in size with greater than 50%  respiratory variability, suggesting right atrial pressure of 3 mmHg.   FINDINGS   Left Ventricle: Left ventricular ejection fraction, by estimation, is 65  to 70%. The left ventricle has normal function. The left ventricle has no  regional wall motion abnormalities. The average left ventricular global  longitudinal strain is -21.7 %.  The global longitudinal strain is normal. The left ventricular internal  cavity size was normal in size. There is no left ventricular hypertrophy.  Left ventricular diastolic parameters were normal.   Right Ventricle: The right ventricular size is normal. No increase in  right ventricular wall thickness. Right ventricular systolic function is  normal. There is normal pulmonary artery systolic pressure. The tricuspid  regurgitant velocity is 2.04 m/s, and   with an assumed right atrial pressure of 3 mmHg, the estimated right  ventricular systolic pressure is 86.5 mmHg.   Left Atrium: Left atrial size was mildly dilated.   Right  Atrium: Right atrial size was normal in size.   Pericardium: There is no evidence of pericardial effusion.   Mitral Valve: The mitral valve is normal in structure. Mild mitral valve  regurgitation. No evidence of mitral valve stenosis.   Tricuspid Valve: The tricuspid valve is normal in structure. Tricuspid  valve regurgitation is mild . No evidence of tricuspid stenosis.   Aortic Valve: The aortic valve is tricuspid. Aortic valve regurgitation is  not visualized. No aortic stenosis is present.   Pulmonic Valve: The pulmonic valve was normal in structure. Pulmonic valve  regurgitation is not visualized. No evidence of pulmonic stenosis.   Aorta: The aortic root is  normal in size and structure.   Venous: The inferior vena cava is normal in size with greater than 50%  respiratory variability, suggesting right atrial pressure of 3 mmHg.   IAS/Shunts: No atrial level shunt detected by color flow Doppler.   EXAM: CT CARDIAC CORONARY ARTERY CALCIUM SCORE   TECHNIQUE: Non-contrast imaging through the heart was performed using prospective ECG gating. Image post processing was performed on an independent workstation, allowing for quantitative analysis of the heart and coronary arteries. Note that this exam targets the heart and the chest was not imaged in its entirety.   COMPARISON:  01/20/2018   FINDINGS: CORONARY CALCIUM SCORES:   Left Main: 0   LAD: 0   LCx: 0   RCA: 0   Total Agatston Score: 0   MESA database percentile: 0   AORTA MEASUREMENTS:   Ascending Aorta: 2.8 cm   Descending Aorta: 2.3 cm   OTHER FINDINGS:   Small amount of calcium involving the aortic root. Heart size is normal. Visualized mediastinal structures are normal. Images of the upper abdomen are unremarkable. Stable tiny nodular density but near the right minor fissure on image 10/6. No airspace disease or consolidation in the visualized lungs. Stable 3 mm nodule in the left lower lobe on image 27/6. Stable mild scarring at the left lung base. Stable tiny nodule in the lingula on image 16/6. No acute bone abnormalities.   IMPRESSION: 1. Coronary calcium score is 0. 2. Stable small pulmonary nodules.     Electronically Signed   By: Markus Daft M.D.   On: 08/29/2022 11:30    Cardiac event monitor 08/24/2022  Patch Wear Time:  13 days and 0 hours (2023-12-13T08:46:21-0500 to 2023-12-26T08:59:37-0500)   Patient had a min HR of 46 bpm, max HR of 187 bpm, and avg HR of 71 bpm. Predominant underlying rhythm was Sinus Rhythm. 6 Supraventricular Tachycardia runs occurred, the run with the fastest interval lasting 11 beats with a max rate of 187 bpm, the   longest lasting 12 beats with an avg rate of 127 bpm. Isolated SVEs were rare (<1.0%), SVE Couplets were rare (<1.0%), and SVE Triplets were rare (<1.0%). Isolated VEs were rare (<1.0%), VE Couplets were rare (<1.0%), and no VE Triplets were present.  Ventricular Bigeminy was present.    Assessment & Plan   1.  Atrial fibrillation, palpitations-heart rate today 68 bpm.  Denies recurrent episodes.  Followed up with [name] in the A-fib clinic 09/15/2022.  Compliant with apixaban and denies bleeding issues. Atrial fibrillation management reviewed, medication and treatment.  Cardiac event monitor reviewed.  This patients CHA2DS2-VASc Score and unadjusted Ischemic Stroke Rate (% per year) is equal to 2.2 % stroke rate/year from a score of 2 [HTN,  Female] Continue  apixaban, diltiazem-May take an extra dose for episodes of palpitations Avoid  triggers caffeine, chocolate, EtOH, dehydration etc. Order home sleep study-Epworth sleep score 14, Stop Bang 4  Essential hypertension-BP today 132/78.   Continue losartan, HCTZ Heart healthy low-sodium diet Increase physical activity as tolerated  GERD-reports increased acid reflux due to discontinuation of Protonix.  Drug interaction between Cardizem and Protonix checked.  No interaction identified. Continue Protonix  Hypothyroidism-reports compliance with Synthroid.  TSH 2.915 on 08/20/2022 Continue Synthroid at current dose Follows with PCP  Disposition: Follow-up with Dr. Ellyn Hack in 4-6 months.  Jossie Ng. Amado Andal [name]     09/24/2022, 9:20 AM [place] [address] Office [phone] Fax [phone]    I spent 14 minutes examining this patient, reviewing medications, and using patient centered shared decision making involving her cardiac care.  Prior to her visit I spent greater than 20 minutes reviewing her past medical history,  medications, and prior cardiac tests.

## 2022-09-24 ENCOUNTER — Ambulatory Visit: Payer: Managed Care, Other (non HMO) | Attending: General Practice | Admitting: General Practice

## 2022-09-24 ENCOUNTER — Encounter: Payer: Self-pay | Admitting: General Practice

## 2022-09-24 ENCOUNTER — Other Ambulatory Visit (HOSPITAL_COMMUNITY): Payer: Self-pay | Admitting: Nurse Practitioner

## 2022-09-24 VITALS — BP 138/82 | HR 68 | Ht 66.0 in | Wt 200.8 lb

## 2022-09-24 DIAGNOSIS — E038 Other specified hypothyroidism: Secondary | ICD-10-CM | POA: Diagnosis not present

## 2022-09-24 DIAGNOSIS — K219 Gastro-esophageal reflux disease without esophagitis: Secondary | ICD-10-CM | POA: Diagnosis not present

## 2022-09-24 DIAGNOSIS — I10 Essential (primary) hypertension: Secondary | ICD-10-CM | POA: Diagnosis not present

## 2022-09-24 DIAGNOSIS — I4891 Unspecified atrial fibrillation: Secondary | ICD-10-CM

## 2022-09-24 NOTE — Patient Instructions (Signed)
Medication Instructions:  The current medical regimen is effective;  continue present plan and medications as directed. Please refer to the Current Medication list given to you today.  *If you need a refill on your cardiac medications before your next appointment, please call your pharmacy*  Lab Work: NONE If you have labs (blood work) drawn today and your tests are completely normal, you will receive your results only by:  [place] (if you have MyChart) ORA paper copy in the mail If you have any lab test that is abnormal or we need to change your treatment, we will call you to review the results.  Testing/Procedures: NONE  Other Instructions Please try to avoid these triggers: Do not use any products that have nicotine or tobacco in them. These include cigarettes, e-cigarettes, and chewing tobacco. If you need help quitting, ask your doctor. Eat heart-healthy foods. Talk with your doctor about the right eating plan for you. Exercise regularly as told by your doctor. Stay hydrated Do not drink alcohol, Caffeine or chocolate. Lose weight if you are overweight. Do not use drugs, including cannabis  Follow-Up: At [facility], you and your health needs are our priority.  As part of our continuing mission to provide you with exceptional heart care, we have created designated Provider Care Teams.  These Care Teams include your primary Cardiologist (physician) and Advanced Practice Providers (APPs -  Physician Assistants and Nurse Practitioners) who all work together to provide you with the care you need, when you need it.  We recommend signing up for the patient portal called "MyChart".  Sign up information is provided on this After Visit Summary.  MyChart is used to connect with patients for Virtual Visits (Telemedicine).  Patients are able to view lab/test results, encounter notes, upcoming appointments, etc.  Non-urgent messages can be sent to your provider as well.   To learn  more about what you can do with MyChart, go to [email or web address].    Your next appointment:   6 month(s)  The format for your next appointment:   In Person  Provider:   Glenetta Hew, MD       Important Information About Sugar

## 2022-10-07 ENCOUNTER — Ambulatory Visit (INDEPENDENT_AMBULATORY_CARE_PROVIDER_SITE_OTHER): Payer: 59 | Admitting: Psychology

## 2022-10-07 DIAGNOSIS — F4310 Post-traumatic stress disorder, unspecified: Secondary | ICD-10-CM | POA: Diagnosis not present

## 2022-10-07 NOTE — Progress Notes (Signed)
[facility] Counselor/Therapist Progress Note  Patient ID: [name], MRN: [id number],    Date: 10/07/2022  Time Spent: 60 minutes  Treatment Type: Individual Therapy  Reported Symptoms: sadness, feelings of helplessness, anxiety and anger  Mental Status Exam: Appearance:  Casual     Behavior: Appropriate  Motor: Normal  Speech/Language:  Clear and Coherent  Affect: Blunt  Mood: pleasant  Thought process: normal  Thought content:   WNL  Sensory/Perceptual disturbances:   WNL  Orientation: oriented to person, place, time/date, and situation  Attention: Good  Concentration: Good  Memory: WNL  Fund of knowledge:  Good  Insight:   Good  Judgment:  Good  Impulse Control: Good   Risk Assessment: Danger to Self:  No Self-injurious Behavior: No Danger to Others: No Duty to Warn:no Physical Aggression / Violence:No  Access to Firearms a concern: No  Gang Involvement:No   Subjective: The patient attended a face-to-face individual therapy session in the office today.  The patient presents with a blunted affect and mood is pleasant.  The patient reports that she seems to have gotten to a better place with acceptance of the fact that she has A-fib.  We discussed her need to be driven and she is aware of where that comes from.  We did talk about possibly doing EMDR to work with that so that she does not have as much in need of the need to do that.  She did report that her sister and her brother feel that she does push herself to produce a lot.  We talked about how she is doing with her meditation and mindfulness and she feels that this is very helpful and she is doing it on a daily basis.  In addition we talked about sleep hygiene and the patient has been staying on her computer until about 1130 or 12 every night.  I went over several strategies to help her with her sleep.  I encouraged her to slow down and try to incorporate a little more time to do meditation and  mindfulness and try to take some things off of her plate.  Interventions: Cognitive Behavioral Therapy, Insight-Oriented, Family Systems, and Interpersonal  Diagnosis:PTSD (post-traumatic stress disorder)  Plan:Plan of Care: Treatment Plan Client Abilities/Strengths  Intelligent, motivated, insightful  Client Treatment Preferences  Outpatient Individual therapy  Client Statement of Needs  "Dr. Casimiro Needle sent me to you because of a trauma I suffered on vacation" Treatment Level  Outpatient Individual therapy  Symptoms  Excessive and/or unrealistic worry that is difficult to control occurring more days than not for at least 6  months about a number of events or activities.: (Status: maintained). Has been  exposed to a traumatic event involving actual or perceived threat of death or serious injury.: (Status: maintained). Hypervigilance (e.g., feeling constantly on edge,  experiencing concentration difficulties, having trouble falling or staying asleep, exhibiting a general  state of irritability).:  (Status: maintained). Impairment in social, occupational,  or other areas of functioning.: (Status: maintained).  Problems Addressed  Posttraumatic Stress Disorder (PTSD),  Goals 1. Eliminate or reduce the negative impact trauma related symptoms have  on social, occupational, and family functioning. Objective Participate in Eye Movement Desensitization and Reprocessing (EMDR) to reduce emotional distress  related to traumatic thoughts, feelings, and images. Target Date: 07/07/2023 Frequency: Weekly  Progress: 0 Modality: individual Related Interventions 1. Utilize Eye Movement Desensitization and Reprocessing (EMDR) to reduce the client's  emotional reactivity to the traumatic event and reduce  PTSD symptoms. 2. Stabilize anxiety level while increasing ability to function on a daily  basis. Objective Learn and implement problem-solving strategies for realistically addressing  worries. Target Date: 07/07/2023 Frequency: Weekly Progress: 0 Modality: individual Related Interventions 1. Teach the client problem-solving strategies involving specifically defining a problem,  generating options for addressing it, evaluating the pros and cons of each option, selecting and  implementing an optional action, and reevaluating and refining the action (or assign "Applying  Problem-Solving to Interpersonal Conflict" in the Adult Psychotherapy Homework Planner by  Bryn Gulling) F43.10 (Posttraumatic stress disorder) - Open - [Signifier: n/a] Posttraumatic Stress  Disorder  Conditions For Discharge Achievement of treatment goals and objectives   The patient has approved this plan   Patriece Archbold G Alexi Geibel, LCSW

## 2022-10-08 ENCOUNTER — Other Ambulatory Visit: Payer: Self-pay | Admitting: General Practice

## 2022-10-08 DIAGNOSIS — I10 Essential (primary) hypertension: Secondary | ICD-10-CM

## 2022-10-08 DIAGNOSIS — I4891 Unspecified atrial fibrillation: Secondary | ICD-10-CM

## 2022-10-12 ENCOUNTER — Telehealth: Payer: Self-pay

## 2022-10-12 NOTE — Telephone Encounter (Signed)
Called and made the patient aware that SHE may proceed with the [facility] Study. PIN # provided to the patient. Patient made aware that SHE will be contacted after the test has been read with the results and any recommendations. Patient verbalized understanding and thanked me for the call.   

## 2022-10-12 NOTE — Addendum Note (Signed)
Addended by: Waylan Rocher on: 10/12/2022 12:19 PM   Modules accepted: Orders

## 2022-10-19 NOTE — Telephone Encounter (Signed)
Also, want to increase hydration up to about 60-70 ounces instead of 48 ounces a day.

## 2022-10-19 NOTE — Telephone Encounter (Signed)
That is pretty much the intention of using Cardizem.  56 is still stable.  Dropping below 51 your sleeping is also okay.  The intention is to decrease the heart rate responsiveness if he did have fast heart rate spells.  We get concerned when heart rates Below 40 but not even usually they are sleeping.Marland Kitchen  Below 50 if you are active.  Glenetta Hew, MD

## 2022-10-22 ENCOUNTER — Other Ambulatory Visit (HOSPITAL_COMMUNITY): Payer: Self-pay | Admitting: Nurse Practitioner

## 2022-10-28 ENCOUNTER — Encounter (HOSPITAL_COMMUNITY): Payer: Self-pay | Admitting: *Deleted

## 2022-10-31 ENCOUNTER — Encounter (INDEPENDENT_AMBULATORY_CARE_PROVIDER_SITE_OTHER): Payer: Managed Care, Other (non HMO) | Admitting: Cardiology

## 2022-10-31 DIAGNOSIS — R0683 Snoring: Secondary | ICD-10-CM | POA: Diagnosis not present

## 2022-10-31 DIAGNOSIS — G4719 Other hypersomnia: Secondary | ICD-10-CM

## 2022-11-01 ENCOUNTER — Ambulatory Visit: Payer: Managed Care, Other (non HMO) | Attending: General Practice

## 2022-11-01 DIAGNOSIS — I10 Essential (primary) hypertension: Secondary | ICD-10-CM

## 2022-11-01 DIAGNOSIS — I4891 Unspecified atrial fibrillation: Secondary | ICD-10-CM

## 2022-11-01 NOTE — Procedures (Signed)
   SLEEP STUDY REPORT Patient Information Study Date: 10/31/2022 Patient Name: [name] Patient ID: [id number] Birth Date: 26-Dec-1955 Age: 67 Gender: Female BMI: 32.2 (W=200 lb, H=5' 6'') Stop Bang: 4 Epworth Sleepiness Score: 14 Referring Physician: Coletta Memos, NP  TEST DESCRIPTION: Home sleep apnea testing was completed using the WatchPat, a Type 1 device, utilizing peripheral arterial tonometry (PAT), chest movement, actigraphy, pulse oximetry, pulse rate, body position and snore. AHI was calculated with apnea and hypopnea using valid sleep time as the denominator. RDI includes apneas, hypopneas, and RERAs. The data acquired and the scoring of sleep and all associated events were performed in accordance with the recommended standards and specifications as outlined in the AASM Manual for the Scoring of Sleep and Associated Events 2.2.0 (2015).   FINDINGS: 1.  No evidence of Obstructive Sleep Apnea with AHI 1.9/hr.  2.  No Central Sleep Apnea. 3.  Oxygen desaturations as low as 89%. 4.  Mild snoring was present. O2 sats were < 88% for 0 minutes. 5.  Total sleep time was 5 hrs and 45 min. 6.  17.1% of total sleep time was spent in REM sleep.  7.  Normal sleep onset latency at 10 min.  8.  Prolonged REM sleep onset latency at 124 min.  9.  Total awakenings were 14.   DIAGNOSIS:  Normal study with no significant sleep disordered breathing.  RECOMMENDATIONS:   1. Normal study with no significant sleep disordered breathing.  2.  Healthy sleep recommendations include:  adequate nightly sleep (normal 7-9 hrs/night), avoidance of caffeine after noon and alcohol near bedtime, and maintaining a sleep environment that is cool, dark and quiet.  3.  Weight loss for overweight patients is recommended.    4.  Snoring recommendations include:  weight loss where appropriate, side sleeping, and avoidance of alcohol before bed.  5.  Operation of motor vehicle or dangerous equipment must  be avoided when feeling drowsy, excessively sleepy, or mentally fatigued.    6.  An ENT consultation which may be useful for specific causes of and possible treatment of bothersome snoring.   7. Weight loss may be of benefit in reducing the severity of snoring.   8.  Consider in lab PSG given normal HST and high Epworth Sleepiness Score.   Signature: Fransico Him, MD; [facility]; [place], [place] Board of Sleep Medicine Electronically Signed: 11/01/2022

## 2022-11-04 ENCOUNTER — Ambulatory Visit: Payer: 59 | Admitting: Psychology

## 2022-11-04 ENCOUNTER — Telehealth: Payer: Self-pay | Admitting: *Deleted

## 2022-11-04 NOTE — Telephone Encounter (Signed)
Left message to return a call to discuss [facility] results and reommemdations.

## 2022-11-05 ENCOUNTER — Other Ambulatory Visit: Payer: Self-pay | Admitting: Cardiology

## 2022-11-05 ENCOUNTER — Other Ambulatory Visit: Payer: Self-pay | Admitting: General Practice

## 2022-11-05 DIAGNOSIS — I4891 Unspecified atrial fibrillation: Secondary | ICD-10-CM

## 2022-11-05 DIAGNOSIS — R400 Somnolence: Secondary | ICD-10-CM

## 2022-11-05 NOTE — Telephone Encounter (Signed)
-----   Message from Sueanne Margarita, MD sent at 11/01/2022 10:31 AM EST ----- Normal home sleep study so please order in lab PSG>>insurance initially denied but now with normal HST and high Stop Bang and Epworth sleepiness score with afib and severe daytime sleepiness hopefully insruance will approve

## 2022-11-05 NOTE — Telephone Encounter (Signed)
Patient notified of normal HST. Provider is ordering in lab sleep study. She agrees to proceed stating "there's no way I do not have sleep apnea."

## 2022-11-18 ENCOUNTER — Ambulatory Visit: Payer: 59 | Admitting: Psychology

## 2022-12-02 ENCOUNTER — Ambulatory Visit: Payer: 59 | Admitting: Psychology

## 2022-12-16 ENCOUNTER — Ambulatory Visit: Payer: 59 | Admitting: Psychology

## 2022-12-23 ENCOUNTER — Other Ambulatory Visit: Payer: Self-pay | Admitting: *Deleted

## 2022-12-23 DIAGNOSIS — I4891 Unspecified atrial fibrillation: Secondary | ICD-10-CM

## 2022-12-23 DIAGNOSIS — I10 Essential (primary) hypertension: Secondary | ICD-10-CM

## 2022-12-30 ENCOUNTER — Ambulatory Visit: Payer: 59 | Admitting: Psychology

## 2023-01-13 ENCOUNTER — Ambulatory Visit
Admission: RE | Admit: 2023-01-13 | Discharge: 2023-01-13 | Disposition: A | Discharge status: Home / Self Care | Payer: Managed Care, Other (non HMO) | Source: Ambulatory Visit | Attending: Internal Medicine | Admitting: Internal Medicine

## 2023-01-13 ENCOUNTER — Other Ambulatory Visit: Payer: Self-pay | Admitting: Internal Medicine

## 2023-01-13 ENCOUNTER — Ambulatory Visit: Payer: 59 | Admitting: Psychology

## 2023-01-13 DIAGNOSIS — R109 Unspecified abdominal pain: Secondary | ICD-10-CM

## 2023-01-27 ENCOUNTER — Ambulatory Visit: Payer: 59 | Admitting: Psychology

## 2023-02-01 ENCOUNTER — Ambulatory Visit (HOSPITAL_BASED_OUTPATIENT_CLINIC_OR_DEPARTMENT_OTHER): Payer: Managed Care, Other (non HMO) | Attending: Cardiology | Admitting: Cardiology

## 2023-02-01 VITALS — Ht 66.0 in | Wt 205.0 lb

## 2023-02-01 DIAGNOSIS — R400 Somnolence: Secondary | ICD-10-CM | POA: Insufficient documentation

## 2023-02-01 DIAGNOSIS — R0683 Snoring: Secondary | ICD-10-CM | POA: Insufficient documentation

## 2023-02-01 DIAGNOSIS — I4891 Unspecified atrial fibrillation: Secondary | ICD-10-CM | POA: Diagnosis not present

## 2023-02-02 NOTE — Procedures (Signed)
   Patient Name: [name], [name] Study Date:  02/01/2023 Gender: Female D.O.B: 04/07/1956 Age (years): 67 Referring Provider: Hazelynn Mckenny MD, ABSM Height (inches): 66 Interpreting Physician: Pilar Westergaard MD, ABSM Weight (lbs): 205 RPSGT: Gregory, Kenyon BMI: 33 MRN: [id number] Neck Size: 13.75  CLINICAL INFORMATION Sleep Study Type: NPSG  Indication for sleep study: Fatigue, Hypertension, Obesity, Snoring  Epworth Sleepiness Score: 4  Most recent polysomnogram dated 10/31/2022 revealed an AHI of 1.9/h.  SLEEP STUDY TECHNIQUE As per the AASM Manual for the Scoring of Sleep and Associated Events v2.3 (April 2016) with a hypopnea requiring 4% desaturations.  The channels recorded and monitored were frontal, central and occipital EEG, electrooculogram (EOG), submentalis EMG (chin), nasal and oral airflow, thoracic and abdominal wall motion, anterior tibialis EMG, snore microphone, electrocardiogram, and pulse oximetry.  MEDICATIONS Medications self-administered by patient taken the night of the study : N/A  SLEEP ARCHITECTURE The study was initiated at 10:39:32 PM and ended at 5:32:51 AM.  Sleep onset time was 28.1 minutes and the sleep efficiency was 81.3%. The total sleep time was 336 minutes.  Stage REM latency was 102.0 minutes.  The patient spent 9.1% of the night in stage N1 sleep, 77.1% in stage N2 sleep, 0.0% in stage N3 and 13.8% in REM.  Alpha intrusion was absent.  Supine sleep was 20.24%.  RESPIRATORY PARAMETERS The overall apnea/hypopnea index (AHI) was 2.7 per hour. There were 0 total apneas, including 0 obstructive, 0 central and 0 mixed apneas. There were 15 hypopneas and 33 RERAs.  The AHI during Stage REM sleep was 18.1 per hour.  AHI while supine was 0.0 per hour.  The mean oxygen saturation was 91.8%. The minimum SpO2 during sleep was 87.0%.  soft snoring was noted during this study.  CARDIAC DATA The 2 lead EKG demonstrated sinus rhythm. The  mean heart rate was 60.8 beats per minute. Other EKG findings include: None.  LEG MOVEMENT DATA The total PLMS were 0 with a resulting PLMS index of 0.0. Associated arousal with leg movement index was 0.0 .  IMPRESSIONS - No significant obstructive sleep apnea occurred during this study (AHI = 2.7/h). - Mild oxygen desaturation was noted during this study (Min O2 = 87.0%). - The patient snored with soft snoring volume. - No cardiac abnormalities were noted during this study. - Clinically significant periodic limb movements did not occur during sleep. No significant associated arousals.  DIAGNOSIS - Normal Study  RECOMMENDATIONS - Avoid alcohol, sedatives and other CNS depressants that may worsen sleep apnea and disrupt normal sleep architecture. - Sleep hygiene should be reviewed to assess factors that may improve sleep quality. - Weight management and regular exercise should be initiated or continued if appropriate.  [Electronically signed] 02/02/2023 09:49 AM  Waneda Klammer MD, ABSM Diplomate, American Board of Sleep Medicine 

## 2023-02-10 ENCOUNTER — Ambulatory Visit: Payer: 59 | Admitting: Psychology

## 2023-02-16 ENCOUNTER — Telehealth: Payer: Self-pay

## 2023-02-16 NOTE — Telephone Encounter (Signed)
-----   Message from Quintella Reichert, MD sent at 02/02/2023  9:52 AM EDT ----- Please let patient know that sleep study showed no significant sleep apnea.

## 2023-02-16 NOTE — Telephone Encounter (Signed)
Left detailed VM, per DPR, explaining sleep study results and recommendations. Left call back number for any questions.

## 2023-02-24 ENCOUNTER — Ambulatory Visit: Payer: 59 | Admitting: Psychology

## 2023-03-10 ENCOUNTER — Ambulatory Visit: Payer: 59 | Admitting: Psychology

## 2023-04-10 ENCOUNTER — Other Ambulatory Visit: Payer: Self-pay | Admitting: Cardiology

## 2023-04-10 DIAGNOSIS — I4891 Unspecified atrial fibrillation: Secondary | ICD-10-CM

## 2023-04-10 NOTE — Progress Notes (Unsigned)
Cardiology Office Note:  .   Date:  04/10/2023  ID:  [name], DOB [birth year], MRN [id number] PCP: Creola Corn, MD  [facility] Providers Cardiologist:  Bryan Lemma, MD { Click to update primary MD,subspecialty MD or APP then REFRESH:1}    No chief complaint on file.   History of Present Illness: [name] is an mildly obese 67 y.o. female with a PMH notable for runs of PAF with previous findings of short bursts of PAT with PACs and PVCs/Palpitations who presents here for *** at the request of Creola Corn, MD.  I last saw [name] on June 22, 2022 for evaluation of palpitations -> ER follow-up.  Apple watch showed frequent PVCs.  Also noted anxiety.  => 14-day Zio patch monitor ordered. => She was then seen in the ER on December 1 for A-fib with RVR with heart rate of 220 beats minute.  She noted nausea and diarrhea.  Took her beta-blocker and went to the ER.  => In the ER she was treated with IV diltiazem and heart rate improved.  She was offered DCCV but declined, and shortly thereafter she was noted to be back in sinus rhythm.  The initial plan had been to admit for evaluation of etiology, once she converted to sinus rhythm she was discharged home on diltiazem and apixaban.   Planned for cardiology, follow-up-seen on 08/24/2022 by Edd Fabian, NP -> apparently noted another episode of tachycardia that was shorter in duration.  (I had recommended considering Multaq).  Several questions about anticoagulation and antiarrhythmic medications discussed. => Recommended Echo and Coronary CTA as well as Sleep Study Evaluation (Epworth score was 14). => She agreed to continue diltiazem but wanted to wait for the results of cardiac event monitor. ->  Referred to [facility]-Fib Clinic.  Was relatively asymptomatic at this visit.  Was last seen on September 15, 2022 by [name], NP.  Had not had any further episodes of A-fib.  Interestingly, her husband had just had A-fib  ablation, but she was wanting a more conservative approach.  Sleep study pending..  Avoiding caffeine tobacco and alcohol.  Has started regular exercise and change diet.  Has lost weight.  No more palpitations or chest pain.  No dyspnea or PND orthopnea or edema.  Was on diltiazem 120 mg daily and Eliquis 5 mg daily along with Hyzaar 50-12.5 mg daily. => Recommended continued conservative therapy with rate control based on low A-fib burden on monitor.  Would consider Multaq or proceeding directly for ablation.  Seen on 09/24/2022 by Edd Fabian, NP for follow-up-no further episodes of A-fib.  Reviewed the monitor results along with echo and Coronary Calcium Score. ->  Home sleep study ordered.  Epworth score 14, STOP-BANG 4.-Sleep study performed, no evidence of OSA.     Subjective  INTERVAL HISTORY [name] returns here today for 61-month follow-up ***  Today she denies chest pain, shortness of breath, lower extremity edema, fatigue, melena, hematuria, hemoptysis, diaphoresis, weakness, presyncope, syncope, orthopnea, and PND   ROS:  Cardiovascular ROS: {roscv:310661} Review of Systems - {ros master:310782}    Objective   Studies Reviewed: .       ~13 Day ZIO Patch Monitor - RESULTS: (12/13-26/2023)    Predominant underlying rhythm was Sinus Rhythm.  Min HR of 46 bpm, max HR of 128 bpm, and avg HR of 71 bpm.   Rare isolated premature atrial contractions PACs (<1 %) noted, with  rare couplets and triplets  Rare isolated premature ventricular contractions (PVCs) (<1 %) noted, with Rare couplets and triplets; bigeminy also present   6 Atrial Runs: Fastest 11 Beats w/ HR Range 138-187 bpm, Avg 170 bpm, 4.1 sec; Longest 12 Beats w/ HR Range 116-141 bpm, Avg 127 bpm, 5.8 sec   No Sustained Arrhythmias: Atrial Tachycardia (AT), Supraventricular Tachycardia (SVT), Atrial Fibrillation (A-Fib), Atrial Flutter (A-Flutter), Sustained Ventricular Tachycardia (VT)   Patient Triggers: SINUS Rhythm  with Isolated PACs, Isolated PVCs (most prominent), & PVCs with Bigeminy/Trigeminy; not with Atrial Runs Echo 09/09/2022: EF 65 to 70%.  No RWMA.  Mild LA dilation. Normal RV size and function.  Normal RAP.  Normal valves. Coronary Calcium Score  09/06/2022: CAC 0 Sleep Study Report 10/31/2022: No evidence of OSA with AHI of 1.9/hour.  No central sleep apnea.  Oxygen desaturation as low as 89%.  Mild snoring present.-Normal study with no significant sleep disordered breathing.  Risk Assessment/Calculations:    CHA2DS2-VASc Score = 2  {Confirm score is correct.  If not, click here to update score.  REFRESH note.  :1} This indicates a 2.2% annual risk of stroke. The patient's score is based upon: CHF History: 0 HTN History: 1 Diabetes History: 0 Stroke History: 0 Vascular Disease History: 0 Age Score: 0 Gender Score: 1     No BP recorded.  {Refresh Note OR Click here to enter BP  :1}***   STOP-Bang Score:  4  { Consider Dx Sleep Disordered Breathing or Sleep Apnea  ICD G47.33          :1}      Physical Exam:   VS:  There were no vitals taken for this visit.   Wt Readings from Last 3 Encounters:  02/01/23 205 lb (93 kg)  09/24/22 200 lb 12.8 oz (91.1 kg)  09/15/22 200 lb 3.2 oz (90.8 kg)    GEN: Well nourished, well groomed in no acute distress; mildly obese NECK: No JVD; No carotid bruits CARDIAC: Distant heart sounds but otherwise normal S1, S2; RRR, no murmurs, rubs, gallops RESPIRATORY:  Clear to auscultation without W/R/R; nonlabored, good air movement. ABDOMEN: Soft, non-tender, non-distended EXTREMITIES:  No edema; No deformity     ASSESSMENT AND PLAN: .    Problem List Items Addressed This Visit   None       {Are you ordering a CV Procedure (e.g. stress test, cath, DCCV, TEE, etc)?   Press F2        :[id number]}   Dispo: No follow-ups on file.  Total time spent: *** min spent with patient + *** min spent charting = *** min  Signed, Marykay Lex, MD,  MS Bryan Lemma, M.D., M.S. Interventional Cardiologist  [facility]  Pager # [phone] Phone # [phone] [address] [place], [place] [address]

## 2023-04-11 ENCOUNTER — Encounter: Payer: Self-pay | Admitting: Cardiology

## 2023-04-11 MED ORDER — APIXABAN 5 MG PO TABS: 5.0000 mg | ORAL_TABLET | Freq: Two times a day (BID) | ORAL | 1 refills | Status: DC

## 2023-04-11 MED ORDER — APIXABAN 5 MG PO TABS: 5.0000 mg | ORAL_TABLET | Freq: Two times a day (BID) | ORAL | 0 refills | Status: DC

## 2023-04-11 NOTE — Telephone Encounter (Signed)
Prescription refill request for Eliquis received. Indication:afib Last office visit:1/24 Scr:0.79  12/23 Age: 67 Weight:93  kg  Prescription refilled

## 2023-04-11 NOTE — Telephone Encounter (Signed)
7 DAY SAMPLE AVAILABLE FOR PICK UP.  PATIENT IS AWARE.

## 2023-04-11 NOTE — Telephone Encounter (Signed)
Error

## 2023-04-11 NOTE — Telephone Encounter (Signed)
Routed to  [facility] Anticoag  pool to manage

## 2023-04-12 ENCOUNTER — Encounter: Payer: Self-pay | Admitting: Cardiology

## 2023-04-12 ENCOUNTER — Telehealth: Payer: Self-pay

## 2023-04-12 ENCOUNTER — Ambulatory Visit: Payer: Managed Care, Other (non HMO) | Attending: Cardiology | Admitting: Cardiology

## 2023-04-12 VITALS — BP 126/82 | HR 65 | Ht 66.0 in | Wt 212.8 lb

## 2023-04-12 DIAGNOSIS — E039 Hypothyroidism, unspecified: Secondary | ICD-10-CM | POA: Diagnosis not present

## 2023-04-12 DIAGNOSIS — I480 Paroxysmal atrial fibrillation: Secondary | ICD-10-CM | POA: Diagnosis not present

## 2023-04-12 DIAGNOSIS — E782 Mixed hyperlipidemia: Secondary | ICD-10-CM

## 2023-04-12 DIAGNOSIS — I4891 Unspecified atrial fibrillation: Secondary | ICD-10-CM

## 2023-04-12 NOTE — Telephone Encounter (Signed)
OPEN IN ERROR 

## 2023-04-12 NOTE — Patient Instructions (Addendum)
Medication Instructions:  No changes  If more  fluttering occur more frequent - you may use as needed- diltiazem 30 mg   *If you need a refill on your cardiac medications before your next appointment, please call your pharmacy*   Lab Work: Not needed      Testing/Procedures: Not needed   Follow-Up: At [facility], you and your health needs are our priority.  As part of our continuing mission to provide you with exceptional heart care, we have created designated Provider Care Teams.  These Care Teams include your primary Cardiologist (physician) and Advanced Practice Providers (APPs -  Physician Assistants and Nurse Practitioners) who all work together to provide you with the care you need, when you need it.     Your next appointment:   6 month(s)  The format for your next appointment:   In Person  Provider:   Edd Fabian NP and then 12 months with Bryan Lemma, MD    Other Instructions  Your physician discussed the importance of regular exercise , diet and recommended that you start or continue a regular exercise program for good health.

## 2023-04-13 ENCOUNTER — Other Ambulatory Visit: Payer: Self-pay

## 2023-04-13 ENCOUNTER — Encounter: Payer: Self-pay | Admitting: Cardiology

## 2023-04-13 MED ORDER — DILTIAZEM HCL ER COATED BEADS 120 MG PO CP24: 120.0000 mg | ORAL_CAPSULE | Freq: Every day | ORAL | 3 refills | Status: DC

## 2023-04-13 NOTE — Assessment & Plan Note (Signed)
Lipids are pretty poorly controlled back in October 2023 with LDL of 228.  Thankfully, her Coronary Calcium Score was relatively low.  I do think it is probably reasonable for her lipids to be treated at least try to get the LDL into the left 150 range.  She probably will need to have more than even statin for herself control.  Has now defer to PCP based on Coronary Calcium Score being 0 and her not having active symptoms, however if we start looking into doing pill in pocket options, he would then need to be progressive and evaluate for ischemia with probably a CT Coronary Angiogram

## 2023-04-13 NOTE — Assessment & Plan Note (Signed)
Currently on Synthroid 137 mcg daily.  I do think if her doses are adjusted, she may have some issues with A-fib.  As long as no stable dose, that should be okay.

## 2023-04-13 NOTE — Assessment & Plan Note (Signed)
Now confirmed to have only brief runs of PAT but actually having short episodes of PAF.  Thankfully the episodes of only been about 4 to 5 hours long but had been tachycardic.  Thankfully, no episodes since December 4 that have been more minute long.  She remains on a stable dose of diltiazem 121.  Not requiring any.  Doses of 30 mg diltiazem.  For now I think we can stay with rate control along with DOAC. Where she to have more recurrent episodes we could then consider investigating potential pill in the pocket options such as flecainide or procainamide.  We would need to do a full ischemic evaluation first.

## 2023-05-14 NOTE — Telephone Encounter (Signed)
Can probably just get away with holding it for 1 day, but if the dermatologist wants to be held for 2 days that is okay as well.  Can restart the following day.

## 2023-06-01 ENCOUNTER — Other Ambulatory Visit: Payer: Self-pay | Admitting: Family

## 2023-06-01 DIAGNOSIS — Z9189 Other specified personal risk factors, not elsewhere classified: Secondary | ICD-10-CM

## 2023-06-07 ENCOUNTER — Telehealth: Payer: Self-pay | Admitting: Orthopaedic Surgery

## 2023-06-08 ENCOUNTER — Ambulatory Visit (INDEPENDENT_AMBULATORY_CARE_PROVIDER_SITE_OTHER): Payer: 59 | Admitting: Psychology

## 2023-06-08 DIAGNOSIS — F4310 Post-traumatic stress disorder, unspecified: Secondary | ICD-10-CM | POA: Diagnosis not present

## 2023-06-08 DIAGNOSIS — F330 Major depressive disorder, recurrent, mild: Secondary | ICD-10-CM

## 2023-06-09 NOTE — Progress Notes (Signed)
[facility] Counselor/Therapist Progress Note  Patient ID: [name], MRN: [id number],    Date: 06/08/2023  Time Spent: 60 minutes  Time in:  4:00 Time out:  5:00  Treatment Type: Individual Therapy  Reported Symptoms: sadness, feelings of helplessness, anxiety and anger  Mental Status Exam: Appearance:  Casual     Behavior: Appropriate  Motor: Normal  Speech/Language:  Clear and Coherent  Affect: Blunt  Mood: pleasant  Thought process: normal  Thought content:   WNL  Sensory/Perceptual disturbances:   WNL  Orientation: oriented to person, place, time/date, and situation  Attention: Good  Concentration: Good  Memory: WNL  Fund of knowledge:  Good  Insight:   Good  Judgment:  Good  Impulse Control: Good   Risk Assessment: Danger to Self:  No Self-injurious Behavior: No Danger to Others: No Duty to Warn:no Physical Aggression / Violence:No  Access to Firearms a concern: No  Gang Involvement:No   Subjective: The patient attended a face-to-face individual therapy session in the office today.  The patient presents with a blunted affect and mood is pleasant.  The patient states that she had gotten into a pattern of not taking care of herself again.  I have not seen her since January and she states that she started working more hours and just missing appointments where she took care of herself.  We talked about what she needs to do to try to work with herself on being able to do a better job of taking care of herself.  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  She states that she is afraid to do that because of the finances.  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  We also talked about her  not doing her yoga or doing her mindfulness and meditation and reverting back to old behaviors that she used to do.  She was able to recognize that that was helping her when she was doing it but she has not done it in quite some time.  In addition we talked about her work situation and she had a moment where she had the realization that it probably would be better if she delegated some of her responsibility to other people and did not try to do everything herself.  The patient says that she is going to prioritize her visits with me and we talked about her implementing some of these things that we discussed today. Interventions: Cognitive Behavioral Therapy, Insight-Oriented, Family Systems, and Interpersonal  Diagnosis:PTSD (post-traumatic stress disorder)  Major depressive disorder, recurrent, mild (HCC)  Plan:Plan of Care: Treatment Plan Client Abilities/Strengths  Intelligent, motivated, insightful  Client Treatment Preferences  Outpatient Individual therapy  Client Statement of Needs  "Dr. Donell Beers sent me to you because of a trauma I suffered on vacation" Treatment Level  Outpatient Individual therapy  Symptoms  Excessive and/or unrealistic worry that is difficult to control occurring more days than not for at least 6  months about a number of events or activities.: (Status: maintained). Has been  exposed to a traumatic event involving actual or perceived threat of death or serious injury.: (Status: maintained). Hypervigilance (e.g., feeling constantly on edge,  experiencing concentration  difficulties, having trouble falling or staying asleep, exhibiting a general  state of irritability).:  (Status: maintained). Impairment in social, occupational,  or other areas of functioning.: (Status: maintained).  Problems Addressed  Posttraumatic Stress Disorder (PTSD),  Goals 1. Eliminate or reduce the negative impact trauma related symptoms have  on social, occupational, and family  functioning. Objective Participate in Eye Movement Desensitization and Reprocessing (EMDR) to reduce emotional distress  related to traumatic thoughts, feelings, and images. Target Date: 07/06/2024 Frequency:   Bi Weekly  Progress: 0 Modality: individual Related Interventions 1. Utilize Eye Movement Desensitization and Reprocessing (EMDR) to reduce the client's  emotional reactivity to the traumatic event and reduce PTSD symptoms. 2. Stabilize anxiety level while increasing ability to function on a daily  basis. Objective Learn and implement problem-solving strategies for realistically addressing worries. Target Date: 07/06/2024 Frequency:  BiWeekly Progress: 0 Modality: individual Related Interventions 1. Teach the client problem-solving strategies involving specifically defining a problem,  generating options for addressing it, evaluating the pros and cons of each option, selecting and  implementing an optional action, and reevaluating and refining the action (or assign "Applying  Problem-Solving to Interpersonal Conflict" in the Adult Psychotherapy Homework Planner by  Stephannie Li) F43.10 (Posttraumatic stress disorder) - Open - [Signifier: n/a] Posttraumatic Stress  Disorder  Conditions For Discharge Achievement of treatment goals and objectives   The patient has approved this plan   Antrice Pal G Lamel Mccarley, LCSW

## 2023-06-22 ENCOUNTER — Ambulatory Visit: Payer: Self-pay | Admitting: Psychology

## 2023-06-27 ENCOUNTER — Other Ambulatory Visit (INDEPENDENT_AMBULATORY_CARE_PROVIDER_SITE_OTHER): Payer: Self-pay

## 2023-06-27 ENCOUNTER — Ambulatory Visit (INDEPENDENT_AMBULATORY_CARE_PROVIDER_SITE_OTHER): Payer: Self-pay | Admitting: Orthopaedic Surgery

## 2023-06-27 VITALS — Ht 66.0 in | Wt 213.0 lb

## 2023-06-27 DIAGNOSIS — M25562 Pain in left knee: Secondary | ICD-10-CM

## 2023-06-27 DIAGNOSIS — G8929 Other chronic pain: Secondary | ICD-10-CM

## 2023-06-27 NOTE — Addendum Note (Signed)
Addended by: Wendi Maya on: 06/27/2023 09:46 AM   Modules accepted: Orders

## 2023-06-27 NOTE — Progress Notes (Signed)
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  She denies any specific injuries but did do a lot of walking and hiking over the last several months participating in an activity corresponding with a sisters hiking trip to [place].  She points to the posterior lateral aspect of her left knee as a source of pain and has had a clicking sensation as well.  She has remote history of a right knee medial meniscal tear that was treated with physical therapy and bracing.  She is of the right knee is actually doing okay.  She denies any effusion.  She cannot take anti-inflammatories because she is on Eliquis for A-fib.  On exam she does have posterior lateral knee pain on that left knee with evidence of some IT band and hamstring pain.  There is no knee joint effusion.  Her Lachman's and McMurray's exams are negative.  She has only slight patellofemoral crepitation with flexion and extension.  The knee is again ligamentously stable.  2 views of the left knee show just some slight medial joint space narrowing but significant patellofemoral arthritic changes.  The right knee also has significant medial narrowing more so on the right knee than the left knee.  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  I would like to send her to outpatient physical therapy for any modalities that could help decrease the pain with her left knee around the IT band but also strength in both knees.  She needs to make sure she wears good supportive shoes which she told me she does.  I would like her to try Voltaren gel which will have a safety profile that is better for someone being on blood thinning medications.  We can also call in a steroid taper if he gets to where this is not helping her.  Will see her back in 4 weeks after course of physical therapy.  All questions and concerns were addressed and answered.

## 2023-07-05 ENCOUNTER — Ambulatory Visit (INDEPENDENT_AMBULATORY_CARE_PROVIDER_SITE_OTHER): Payer: Managed Care, Other (non HMO) | Admitting: Physical Therapy

## 2023-07-05 ENCOUNTER — Other Ambulatory Visit: Payer: Self-pay

## 2023-07-05 ENCOUNTER — Encounter: Payer: Self-pay | Admitting: Physical Therapy

## 2023-07-05 DIAGNOSIS — R262 Difficulty in walking, not elsewhere classified: Secondary | ICD-10-CM | POA: Diagnosis not present

## 2023-07-05 DIAGNOSIS — R29810 Facial weakness: Secondary | ICD-10-CM

## 2023-07-05 DIAGNOSIS — G8929 Other chronic pain: Secondary | ICD-10-CM

## 2023-07-05 DIAGNOSIS — M25562 Pain in left knee: Secondary | ICD-10-CM

## 2023-07-05 NOTE — Therapy (Signed)
OUTPATIENT PHYSICAL THERAPY LOWER EXTREMITY EVALUATION   Patient Name: [name] MRN: [id number] DOB:[birth year], 67 y.o., female Today's Date: 07/05/2023  END OF SESSION:  PT End of Session - 07/05/23 1602     Visit Number 1    Number of Visits 20    Date for PT Re-Evaluation 09/16/23    PT Start Time 1510    PT Stop Time 1548    PT Time Calculation (min) 38 min    Activity Tolerance Patient tolerated treatment well    Behavior During Therapy WFL for tasks assessed/performed             Past Medical History:  Diagnosis Date   GERD (gastroesophageal reflux disease)    Hypothyroid    Acquired.  Has had radiofrequency ablation   Past Surgical History:  Procedure Laterality Date   TONSILLECTOMY  1962   UPPER GASTROINTESTINAL ENDOSCOPY     WRIST FRACTURE SURGERY  2002   right wrist   Patient Active Problem List   Diagnosis Date Noted   PAF (paroxysmal atrial fibrillation) (HCC) 08/20/2022   Paroxysmal tachycardia (HCC) 04/08/2021   Mucoid cyst of joint 11/20/2015   Palpitations 09/04/2015   Hypothyroidism 12/21/2007   Hyperlipidemia 12/21/2007   RHINOSINUSITIS, ALLERGIC 12/21/2007   GERD 12/21/2007    PCP: Creola Corn, MD   REFERRING PROVIDER: Kathryne Hitch*   REFERRING DIAG: [id number] (ICD-10-CM) - Chronic pain of left knee   THERAPY DIAG:  Chronic pain of left knee  Difficulty in walking, not elsewhere classified  Facial weakness  Rationale for Evaluation and Treatment: Rehabilitation  ONSET DATE: months  SUBJECTIVE:   SUBJECTIVE STATEMENT: Pt reporting going hiking 3-4 weeks ago she was hiking almost 14 miles a day and it increased her pain.   PERTINENT HISTORY: GERD, Hypothyroid, A-fib (per pt report), wrist fracture, h/o Rt knee meniscus tear (per pt report)  PAIN:  NPRS scale: 2/10 at rest, can reach 8/10 Pain location: left knee Pain description: achy, pinching, sharp Aggravating factors: turning, prolonged  walking Relieving factors: when it's bent  PRECAUTIONS: None  WEIGHT BEARING RESTRICTIONS: No  FALLS:  Has patient fallen in last 6 months? No  LIVING ENVIRONMENT: Lives with: lives with their family Lives in: House/apartment Stairs: Yes: Internal: 9 steps; on right going up and External: 3  steps; on right going up Has following equipment at home: None  OCCUPATION: flip house, [employer]  PLOF: Independent  PATIENT GOALS: walk without pain, be able to exercise, walk dog with no pain  Next MD visit:   OBJECTIVE:   DIAGNOSTIC FINDINGS: 06/27/23:   2 views of the left knee showed no acute findings.  There is  patellofemoral arthritic changes and anterior and posterior osteophytes.   The medial and lateral compartments are well-maintained with just slight  medial narrowing.  The AP view shows the right knee as well with  significant medial narrowing.   PATIENT SURVEYS:  07/05/23: FOTO intake:   43  COGNITION: Overall cognitive status: WFL    SENSATION: WFL    POSTURE:  rounded shoulders, forward head, and increased lumbar lordosis    LOWER EXTREMITY ROM:   ROM Right 07/05/23 Active  supine Left 07/05/23 Active  supine  Hip flexion    Hip extension    Hip abduction    Hip adduction    Hip internal rotation    Hip external rotation    Knee flexion 120 128  Knee extension 0 0  Ankle dorsiflexion  Ankle plantarflexion    Ankle inversion    Ankle eversion     (Blank rows = not tested)  LOWER EXTREMITY MMT:  MMT Right 07/05/23 Left 07/05/23  Hip flexion 5 5  Hip extension    Hip abduction 4+ 5  Hip adduction 4+ 5  Hip internal rotation    Hip external rotation    Knee flexion 4 5  Knee extension 4- 5  Ankle dorsiflexion    Ankle plantarflexion    Ankle inversion    Ankle eversion     (Blank rows = not tested)   FUNCTIONAL TESTS:  07/05/23 5 time sit to stand: 11.1 seconds with no UE support  GAIT: Distance walked:  clinic distance  Assistive device utilized: None Level of assistance: Complete Independence Comments: pt favoring Rt LE, mild antalgic gait                                                                                                                                                                         TODAY'S TREATMENT                                                                          DATE: 07/05/23:  Therex:    HEP instruction/performance c cues for techniques, handout provided.  Trial set performed of each for comprehension and symptom assessment.  See below for exercise list  PATIENT EDUCATION:  Education details: HEP, POC Person educated: Patient Education method: Explanation, Demonstration, Verbal cues, and Handouts Education comprehension: verbalized understanding, returned demonstration, and verbal cues required  HOME EXERCISE PROGRAM: Access Code: [email or web address] URL: [email or web address] Date: 07/05/2023 Prepared by: Narda Amber  Exercises - Supine Bridge  - 1-2 x daily - 7 x weekly - 2 sets - 10 reps - 5 seconds hold - Supine Active Straight Leg Raise  - 1-2 x daily - 7 x weekly - 2 sets - 10 reps - Straight Leg Raise with External Rotation  - 1-2 x daily - 7 x weekly - 10 reps - Seated Hamstring Stretch  - 1-2 x daily - 7 x weekly - 3 reps - 30 seonds hold - Recumbent Bike  - 1 x daily - 5 x weekly - 10 minutes hold  ASSESSMENT:  CLINICAL IMPRESSION: Patient is a 67 y.o. who comes to clinic with complaints of left knee pain with mobility, strength and movement coordination deficits that impair their ability to perform usual daily and recreational functional activities  without increase difficulty/symptoms at this time.  Patient to benefit from skilled PT services to address impairments and limitations to improve to previous level of function without restriction secondary to condition.   OBJECTIVE IMPAIRMENTS: decreased mobility, difficulty walking,  decreased strength, and pain.   ACTIVITY LIMITATIONS: standing, squatting, and stairs  PARTICIPATION LIMITATIONS: shopping, community activity, and occupation  PERSONAL FACTORS: 3+ comorbidities: see pertinent history  are also affecting patient's functional outcome.   REHAB POTENTIAL: Excellent  CLINICAL DECISION MAKING: Stable/uncomplicated  EVALUATION COMPLEXITY: Low   GOALS: Goals reviewed with patient? Yes  SHORT TERM GOALS: (target date for Short term goals are 3 weeks 07/29/23)   1.  Patient will demonstrate independent use of home exercise program to maintain progress from in clinic treatments.  Goal status: New  LONG TERM GOALS: (target dates for all long term goals are 10 weeks  09/16/23 )   1. Patient will demonstrate/report pain at worst less than or equal to 2/10 to facilitate minimal limitation in daily activity secondary to pain symptoms.  Goal status: New   2. Patient will demonstrate independent use of home exercise program to facilitate ability to maintain/progress functional gains from skilled physical therapy services.  Goal status: New   3. Patient will demonstrate FOTO outcome > or = 60 % to indicate reduced disability due to condition.  Goal status: New   4.  Patient will demonstrate left  LE MMT 5/5 throughout to faciltiate usual transfers, stairs, squatting at [facility] for daily life.   Goal status: New   5.  Patient will demonstrate up and down 1 flight of stairs reciprocal gait pattern with no rail with no pain reported in left knee.  Goal status: New   6.  pt will be able to report walking 30 minutes in community with left knee pain  </= 2/10.  Goal status: New      PLAN:  PT FREQUENCY: 1-2x/week  PT DURATION: 10 weeks  PLANNED INTERVENTIONS: 97146- PT Re-evaluation, 97110-Therapeutic exercises, 97530- Therapeutic activity, [id number]- Neuromuscular re-education, 97535- Self Care, 16109- Manual therapy, [phone]- Gait training, [phone]- Orthotic  Fit/training, [phone]- Canalith repositioning, [id number]- Aquatic Therapy, 97014- Electrical stimulation (unattended), [id number]- Electrical stimulation (manual), [id number]- Vasopneumatic device, [id number]- Ultrasound, [id number]- Traction (mechanical), [id number]- Ionotophoresis 4mg /ml Dexamethasone, Patient/Family education, Balance training, Stair training, Taping, Dry Needling, Joint mobilization, Joint manipulation, Spinal manipulation, Spinal mobilization, Scar mobilization, Vestibular training, Visual/preceptual remediation/compensation, DME instructions, Cryotherapy, and Moist heat.  All preferred as medically necessary.  All included unless contraindicated  PLAN FOR NEXT SESSION: Review HEP knowledge/results, LE strengthening, knee stability exercises, modalities as needed for pain.      Sharmon Leyden, PT, MPT 07/05/2023, 4:03 PM

## 2023-07-06 ENCOUNTER — Ambulatory Visit: Payer: Self-pay | Admitting: Psychology

## 2023-07-11 ENCOUNTER — Telehealth: Payer: Self-pay | Admitting: Cardiology

## 2023-07-11 DIAGNOSIS — I4891 Unspecified atrial fibrillation: Secondary | ICD-10-CM

## 2023-07-11 MED ORDER — APIXABAN 5 MG PO TABS: 5.0000 mg | ORAL_TABLET | Freq: Two times a day (BID) | ORAL | 1 refills | Status: DC

## 2023-07-11 NOTE — Telephone Encounter (Signed)
*  STAT* If patient is at the pharmacy, call can be transferred to refill team.   1. Which medications need to be refilled? (please list name of each medication and dose if known) apixaban (ELIQUIS) 5 MG TABS tablet  2. Which pharmacy/location (including street and city if local pharmacy) is medication to be sent to? [id number] - [place], [place] - [address]  3. Do they need a 30 day or 90 day supply? 90  

## 2023-07-11 NOTE — Telephone Encounter (Signed)
Pt last saw Dr Herbie Baltimore 04/12/23, last labs 09/15/22 Creat 0.79, age 67, weight 96.6kg, based on specified criteria pt is on appropriate dosage of Eliquis 5mg  BID for afib.  Will refill rx.

## 2023-07-12 ENCOUNTER — Encounter: Payer: Managed Care, Other (non HMO) | Admitting: Physical Therapy

## 2023-07-12 NOTE — Therapy (Deleted)
OUTPATIENT PHYSICAL THERAPY TREATMENT   Patient Name: [name] MRN: [id number] DOB:08/28/1956, 67 y.o., female Today's Date: 07/12/2023  END OF SESSION:    Past Medical History:  Diagnosis Date   GERD (gastroesophageal reflux disease)    Hypothyroid    Acquired.  Has had radiofrequency ablation   Past Surgical History:  Procedure Laterality Date   TONSILLECTOMY  1962   UPPER GASTROINTESTINAL ENDOSCOPY     WRIST FRACTURE SURGERY  2002   right wrist   Patient Active Problem List   Diagnosis Date Noted   PAF (paroxysmal atrial fibrillation) (HCC) 08/20/2022   Paroxysmal tachycardia (HCC) 04/08/2021   Mucoid cyst of joint 11/20/2015   Palpitations 09/04/2015   Hypothyroidism 12/21/2007   Hyperlipidemia 12/21/2007   RHINOSINUSITIS, ALLERGIC 12/21/2007   GERD 12/21/2007    PCP: Creola Corn, MD   REFERRING PROVIDER: Creola Corn, MD   REFERRING DIAG: [phone] (ICD-10-CM) - Chronic pain of left knee   THERAPY DIAG:  No diagnosis found.  Rationale for Evaluation and Treatment: Rehabilitation  ONSET DATE: months  SUBJECTIVE:   SUBJECTIVE STATEMENT: *** Pt reporting going hiking 3-4 weeks ago she was hiking almost 14 miles a day and it increased her pain.   PERTINENT HISTORY: GERD, Hypothyroid, A-fib (per pt report), wrist fracture, h/o Rt knee meniscus tear (per pt report)  PAIN:  NPRS scale: 2/10 at rest, can reach 8/10 Pain location: left knee Pain description: achy, pinching, sharp Aggravating factors: turning, prolonged walking Relieving factors: when it's bent  PRECAUTIONS: None  WEIGHT BEARING RESTRICTIONS: No  FALLS:  Has patient fallen in last 6 months? No  LIVING ENVIRONMENT: Lives with: lives with their family Lives in: House/apartment Stairs: Yes: Internal: 9 steps; on right going up and External: 3  steps; on right going up Has following equipment at home: None  OCCUPATION: flip house, [employer]  PLOF:  Independent  PATIENT GOALS: walk without pain, be able to exercise, walk dog with no pain  Next MD visit:   OBJECTIVE:   DIAGNOSTIC FINDINGS: 06/27/23:   2 views of the left knee showed no acute findings.  There is  patellofemoral arthritic changes and anterior and posterior osteophytes.   The medial and lateral compartments are well-maintained with just slight  medial narrowing.  The AP view shows the right knee as well with  significant medial narrowing.   PATIENT SURVEYS:  07/05/23: FOTO intake:   43  POSTURE:  rounded shoulders, forward head, and increased lumbar lordosis    LOWER EXTREMITY ROM:   ROM Right 07/05/23 Active  supine Left 07/05/23 Active  supine  Hip flexion    Hip extension    Hip abduction    Hip adduction    Hip internal rotation    Hip external rotation    Knee flexion 120 128  Knee extension 0 0  Ankle dorsiflexion    Ankle plantarflexion    Ankle inversion    Ankle eversion     (Blank rows = not tested)  LOWER EXTREMITY MMT:  MMT Right 07/05/23 Left 07/05/23  Hip flexion 5 5  Hip extension    Hip abduction 4+ 5  Hip adduction 4+ 5  Hip internal rotation    Hip external rotation    Knee flexion 4 5  Knee extension 4- 5  Ankle dorsiflexion    Ankle plantarflexion    Ankle inversion    Ankle eversion     (Blank rows = not tested)   FUNCTIONAL TESTS:  07/05/23  5 time sit to stand: 11.1 seconds with no UE support  GAIT: Distance walked: clinic distance  Assistive device utilized: None Level of assistance: Complete Independence Comments: pt favoring Rt LE, mild antalgic gait                                                                                                                                                                        [facility] Adult PT Treatment:                                                DATE: 07/12/23 Therapeutic Exercise: *** Manual Therapy: *** Neuromuscular re-ed: *** Therapeutic  Activity: *** Gait: *** Modalities: *** Self Care: ***  TODAY'S TREATMENT                                                                          DATE: 07/05/23:  Therex:    HEP instruction/performance c cues for techniques, handout provided.  Trial set performed of each for comprehension and symptom assessment.  See below for exercise list  PATIENT EDUCATION:  Education details: HEP, POC Person educated: Patient Education method: Explanation, Demonstration, Verbal cues, and Handouts Education comprehension: verbalized understanding, returned demonstration, and verbal cues required  HOME EXERCISE PROGRAM: Access Code: [email or web address] URL: [email or web address] Date: 07/05/2023 Prepared by: Narda Amber  Exercises - Supine Bridge  - 1-2 x daily - 7 x weekly - 2 sets - 10 reps - 5 seconds hold - Supine Active Straight Leg Raise  - 1-2 x daily - 7 x weekly - 2 sets - 10 reps - Straight Leg Raise with External Rotation  - 1-2 x daily - 7 x weekly - 10 reps - Seated Hamstring Stretch  - 1-2 x daily - 7 x weekly - 3 reps - 30 seonds hold - Recumbent Bike  - 1 x daily - 5 x weekly - 10 minutes hold  ASSESSMENT:  CLINICAL IMPRESSION: ***  From eval: Patient is a 67 y.o. who comes to clinic with complaints of left knee pain with mobility, strength and movement coordination deficits that impair their ability to perform usual daily and recreational functional activities without increase difficulty/symptoms at this time.  Patient to benefit from skilled PT services to address impairments and limitations to improve to previous level of function without restriction secondary to  condition.   OBJECTIVE IMPAIRMENTS: decreased mobility, difficulty walking, decreased strength, and pain.     GOALS: Goals reviewed with patient? Yes  SHORT TERM GOALS: (target date for Short term goals are 3 weeks 07/29/23)   1.  Patient will demonstrate independent use of home exercise program to  maintain progress from in clinic treatments.  Goal status: New  LONG TERM GOALS: (target dates for all long term goals are 10 weeks  09/16/23 )   1. Patient will demonstrate/report pain at worst less than or equal to 2/10 to facilitate minimal limitation in daily activity secondary to pain symptoms.  Goal status: New   2. Patient will demonstrate independent use of home exercise program to facilitate ability to maintain/progress functional gains from skilled physical therapy services.  Goal status: New   3. Patient will demonstrate FOTO outcome > or = 60 % to indicate reduced disability due to condition.  Goal status: New   4.  Patient will demonstrate left  LE MMT 5/5 throughout to faciltiate usual transfers, stairs, squatting at [facility] for daily life.   Goal status: New   5.  Patient will demonstrate up and down 1 flight of stairs reciprocal gait pattern with no rail with no pain reported in left knee.  Goal status: New   6.  pt will be able to report walking 30 minutes in community with left knee pain  </= 2/10.  Goal status: New      PLAN:  PT FREQUENCY: 1-2x/week  PT DURATION: 10 weeks  PLANNED INTERVENTIONS: 97146- PT Re-evaluation, 97110-Therapeutic exercises, 97530- Therapeutic activity, [id number]- Neuromuscular re-education, 97535- Self Care, 16109- Manual therapy, [phone]- Gait training, [phone]- Orthotic Fit/training, [phone]- Canalith repositioning, [id number]- Aquatic Therapy, 97014- Electrical stimulation (unattended), [id number]- Electrical stimulation (manual), [id number]- Vasopneumatic device, [id number]- Ultrasound, [id number]- Traction (mechanical), [id number]- Ionotophoresis 4mg /ml Dexamethasone, Patient/Family education, Balance training, Stair training, Taping, Dry Needling, Joint mobilization, Joint manipulation, Spinal manipulation, Spinal mobilization, Scar mobilization, Vestibular training, Visual/preceptual remediation/compensation, DME instructions, Cryotherapy, and Moist heat.  All preferred  as medically necessary.  All included unless contraindicated  PLAN FOR NEXT SESSION: Review HEP knowledge/results, LE strengthening, knee stability exercises, modalities as needed for pain.      Auryn Paige April Ma L Emira Eubanks, PT, DPT 07/12/2023, 10:15 AM

## 2023-07-19 ENCOUNTER — Encounter: Payer: Managed Care, Other (non HMO) | Admitting: Physical Therapy

## 2023-07-20 ENCOUNTER — Ambulatory Visit: Payer: Self-pay | Admitting: Psychology

## 2023-07-25 ENCOUNTER — Ambulatory Visit: Payer: Self-pay | Admitting: Orthopaedic Surgery

## 2023-07-26 ENCOUNTER — Encounter: Payer: Managed Care, Other (non HMO) | Admitting: Physical Therapy

## 2023-08-02 ENCOUNTER — Encounter: Payer: Managed Care, Other (non HMO) | Admitting: Physical Therapy

## 2023-08-03 ENCOUNTER — Ambulatory Visit: Payer: Managed Care, Other (non HMO) | Admitting: Psychology

## 2023-08-09 ENCOUNTER — Encounter: Payer: Managed Care, Other (non HMO) | Admitting: Physical Therapy

## 2023-08-09 ENCOUNTER — Telehealth: Payer: Self-pay | Admitting: Physical Therapy

## 2023-08-09 NOTE — Telephone Encounter (Signed)
I called pt to follow up after she missed her 3:15 PT appointment today. I left a message to  reminded pt of her next appointment on 08/16/23 at 3:15.   Narda Amber, PT, MPT 08/09/23 3:40 PM

## 2023-08-15 ENCOUNTER — Ambulatory Visit: Payer: Self-pay | Admitting: Orthopaedic Surgery

## 2023-08-16 ENCOUNTER — Encounter: Payer: Managed Care, Other (non HMO) | Admitting: Physical Therapy

## 2023-08-17 ENCOUNTER — Ambulatory Visit: Payer: Self-pay | Admitting: Psychology

## 2023-08-19 ENCOUNTER — Other Ambulatory Visit: Payer: Self-pay

## 2023-08-19 ENCOUNTER — Emergency Department (HOSPITAL_BASED_OUTPATIENT_CLINIC_OR_DEPARTMENT_OTHER)
Admission: EM | Admit: 2023-08-19 | Discharge: 2023-08-19 | Disposition: A | Discharge status: Home / Self Care | Payer: Managed Care, Other (non HMO) | Attending: Emergency Medicine | Admitting: Emergency Medicine

## 2023-08-19 ENCOUNTER — Other Ambulatory Visit (HOSPITAL_COMMUNITY): Payer: Self-pay

## 2023-08-19 ENCOUNTER — Emergency Department (HOSPITAL_BASED_OUTPATIENT_CLINIC_OR_DEPARTMENT_OTHER): Payer: Managed Care, Other (non HMO)

## 2023-08-19 ENCOUNTER — Telehealth: Payer: Self-pay | Admitting: Pharmacy Technician

## 2023-08-19 DIAGNOSIS — Z79899 Other long term (current) drug therapy: Secondary | ICD-10-CM | POA: Insufficient documentation

## 2023-08-19 DIAGNOSIS — W260XXA Contact with knife, initial encounter: Secondary | ICD-10-CM | POA: Insufficient documentation

## 2023-08-19 DIAGNOSIS — S6992XA Unspecified injury of left wrist, hand and finger(s), initial encounter: Secondary | ICD-10-CM | POA: Diagnosis present

## 2023-08-19 DIAGNOSIS — Z7901 Long term (current) use of anticoagulants: Secondary | ICD-10-CM | POA: Insufficient documentation

## 2023-08-19 DIAGNOSIS — S61412A Laceration without foreign body of left hand, initial encounter: Secondary | ICD-10-CM | POA: Insufficient documentation

## 2023-08-19 DIAGNOSIS — E039 Hypothyroidism, unspecified: Secondary | ICD-10-CM | POA: Insufficient documentation

## 2023-08-19 DIAGNOSIS — S61419A Laceration without foreign body of unspecified hand, initial encounter: Secondary | ICD-10-CM

## 2023-08-19 MED ORDER — LIDOCAINE-EPINEPHRINE (PF) 2 %-1:200000 IJ SOLN
10.0000 mL | Freq: Once | INTRAMUSCULAR | Status: AC
  Administered 2023-08-19: 10 mL
  Filled 2023-08-19: qty 20

## 2023-08-19 NOTE — ED Notes (Signed)
Discharge paperwork given and verbally understood. 

## 2023-08-19 NOTE — Telephone Encounter (Signed)
Pharmacy Patient Advocate Encounter   Received notification from CoverMyMeds that prior authorization for eliquis is required/requested.   Insurance verification completed.   The patient is insured through [employer] .   Per test claim: Refill too soon. PA is not needed at this time. Medication was filled 08/06/23. Next eligible fill date is 10/23/23.

## 2023-08-19 NOTE — ED Provider Notes (Signed)
[place] EMERGENCY DEPARTMENT AT [facility] Provider Note   CSN: [id number] Arrival date & time: 08/19/23  1704     History  Chief Complaint  Patient presents with   Laceration    [name] is a 67 y.o. female.   Laceration Patient with laceration.  Cut with a knife while opening a box.  Tetanus is up-to-date.  Has approximately 1.5 cm laceration on dorsum of left thumb.    Past Medical History:  Diagnosis Date   GERD (gastroesophageal reflux disease)    Hypothyroid    Acquired.  Has had radiofrequency ablation    Home Medications Prior to Admission medications   Medication Sig Start Date End Date Taking? Authorizing Provider  allopurinol (ZYLOPRIM) 100 MG tablet Take 100 mg by mouth daily. 04/07/21   [provider]  apixaban (ELIQUIS) 5 MG TABS tablet Take 1 tablet (5 mg total) by mouth 2 (two) times daily. 07/11/23 01/07/24  Marykay Lex, MD  diltiazem (CARDIZEM CD) 120 MG 24 hr capsule Take 1 capsule (120 mg total) by mouth daily. May take extra capsume for sustained palpitations. 04/13/23   Marykay Lex, MD  diltiazem (CARDIZEM) 30 MG tablet TAKE 1 TABLET EVERY 4 HOURS AS NEEDED FOR AFIB HEART RATE >100 AS LONG AS TOP BP >100. 09/24/22   Newman Nip, NP  losartan-hydrochlorothiazide (HYZAAR) 50-12.5 MG tablet Take 1 tablet by mouth daily.  03/15/16   [provider]  pantoprazole (PROTONIX) 40 MG tablet Take 40 mg by mouth daily. 02/13/20   [provider]  SYNTHROID 137 MCG tablet Take 137 mcg by mouth daily. 01/08/20   [provider]      Allergies    Macrodantin [nitrofurantoin]    Review of Systems   Review of Systems  Physical Exam Updated Vital Signs BP (!) 145/82 (BP Location: Right Arm)   Pulse 76   Temp 98 F (36.7 C) (Oral)   Resp 18   Ht 5\' 5"  (1.651 m)   Wt 90.7 kg   SpO2 97%   BMI 33.28 kg/m  Physical Exam Vitals and nursing note reviewed.  Musculoskeletal:     Comments: 1.5 cm  laceration to dorsum of left thumb over the metacarpal.  No tendon visualized.  Neurological:     Mental Status: She is alert.     ED Results / Procedures / Treatments   Labs (all labs ordered are listed, but only abnormal results are displayed) Labs Reviewed - No data to display  EKG None  Radiology DG Hand Complete Left  Result Date: 08/19/2023 CLINICAL DATA:  Laceration to base of thumb. EXAM: LEFT HAND - COMPLETE 3 VIEW COMPARISON:  None Available. FINDINGS: Osteopenia. No fracture or dislocation. Preserved joint spaces. No radiopaque foreign body. Please correlate for exact location of injury. IMPRESSION: Osteopenia.  No acute osseous abnormality. Electronically Signed   By: Karen Kays M.D.   On: 08/19/2023 18:00    Procedures .Laceration Repair  Date/Time: 08/19/2023 8:25 PM  Performed by: Benjiman Core, MD Authorized by: Benjiman Core, MD   Consent:    Consent obtained:  Verbal   Consent given by:  Patient Universal protocol:    Patient identity confirmed:  Verbally with patient Anesthesia:    Anesthesia method:  Local infiltration Laceration details:    Length (cm):  1.5 Pre-procedure details:    Preparation:  Patient was prepped and draped in usual sterile fashion Exploration:    Limited defect created (wound extended): no  Contaminated: no   Treatment:    Area cleansed with:  Shur-Clens   Amount of cleaning:  Standard   Debridement:  None Skin repair:    Repair method:  Sutures   Suture size:  4-0   Suture material:  Prolene   Suture technique:  Simple interrupted   Number of sutures:  4 Approximation:    Approximation:  Close Repair type:    Repair type:  Simple Post-procedure details:    Dressing:  Open (no dressing)   Procedure completion:  Tolerated well, no immediate complications     Medications Ordered in ED Medications  lidocaine-EPINEPHrine (XYLOCAINE W/EPI) 2 %-1:200000 (PF) injection 10 mL (10 mLs Infiltration Given by  Other 08/19/23 2031)    ED Course/ Medical Decision Making/ A&P                                 Medical Decision Making Amount and/or Complexity of Data Reviewed Radiology: ordered.  Risk Prescription drug management.   Patient with laceration of left hand.  Sutured.  No tendon laceration seen.        Final Clinical Impression(s) / ED Diagnoses Final diagnoses:  Laceration of dorsum of hand    Rx / DC Orders ED Discharge Orders     None         Benjiman Core, MD 08/19/23 2336

## 2023-08-19 NOTE — ED Triage Notes (Signed)
Pt POV with 1cm lac to hand after cutting open a box, on eliquis, bleeding controlled at this time. Tetanus UTD

## 2023-08-19 NOTE — Discharge Instructions (Signed)
The stitches can come out in about a week. ?

## 2023-08-31 ENCOUNTER — Ambulatory Visit: Payer: Self-pay | Admitting: Psychology

## 2023-09-07 ENCOUNTER — Ambulatory Visit: Payer: Self-pay | Admitting: Orthopaedic Surgery

## 2023-09-21 ENCOUNTER — Encounter: Payer: Self-pay | Admitting: Cardiology

## 2023-10-06 NOTE — Progress Notes (Unsigned)
Cardiology Clinic Note   Patient Name: [name] Date of Encounter: 10/06/2023  Primary Care Provider:  Creola Corn, MD Primary Cardiologist:  Bryan Lemma, MD  Patient Profile    [name] 68 year old female presents the clinic today for follow-up evaluation of her atrial fibrillation with RVR.  Past Medical History    Past Medical History:  Diagnosis Date   GERD (gastroesophageal reflux disease)    Hypothyroid    Acquired.  Has had radiofrequency ablation   Past Surgical History:  Procedure Laterality Date   TONSILLECTOMY  1962   UPPER GASTROINTESTINAL ENDOSCOPY     WRIST FRACTURE SURGERY  2002   right wrist    Allergies  Allergies  Allergen Reactions   Macrodantin [Nitrofurantoin] Hives    Shortness of breath    History of Present Illness    [name] is a PMH of hypothyroidism with previous history of SVT, GERD and paroxysmal atrial fibrillation with RVR.  She reported that around 3 AM she woke up and her heart was racing.  She checked her pulse rate via monitor at home and reported a heart rate in the 220s.  She took her home metoprolol and presented to the emergency department.  She noted nausea but did not vomit.  She reported diarrhea.  She noted mild chest tightness and shortness of breath.  Her main complaint was palpitations.  She received IV Cardizem and her heart rate decreased.  She was noted to still be in atrial fibrillation.  She was offered DCCV but was hesitant.  She was also offered admission.  She was reassessed at 10:20 AM.  Her heart rate was noted to be in the 120s and she continued to be in atrial fibrillation.  Etiology of her atrial fibrillation was unclear.  Plan was made for admission for echocardiogram as well as further workup for atrial fibrillation.  She was started on p.o. diltiazem and apixaban 5 mg twice daily.  Her CBC showed slightly elevated platelets 426.  Her chest x-ray showed no acute abnormalities.  Her BMP  showed slightly elevated glucose at 133.  Cardiology followed up with patient and she was noted to be in sinus rhythm.  Outpatient evaluation with cardiac event monitor was planned.  She was discharged in stable condition.  She presented to the clinic 08/24/22 for follow-up evaluation and stated she had a more severe episode of symptoms similar to when she presented to the hospital that were shorter in duration.  Dr. Herbie Baltimore recommended Multaq therapy.  She had several questions about atrial fibrillation, antiarrhythmic medication, and treatment.  We used shared decision making to review need for echocardiogram, coronary CTA, and sleep evaluation as well as cardiac event monitor.  She presented with her husband.  She wished to continue diltiazem at the time and wait on results from cardiac event monitor.  She works at [employer].  I ordered echocardiogram, coronary CTA, split-night sleep study, 14-day cardiac event monitor, and continued her diltiazem.  I refered to atrial fibrillation clinic and asked her to avoid triggers for palpitations.  She expressed understanding.  We reviewed importance of continued apixaban therapy.  We will plan follow-up for after coronary CTA with Dr. Herbie Baltimore.  Coronary CTA showed a coronary calcium score of 0.  Echocardiogram showed normal EF and no significant valvular abnormalities.  She followed up with [name], [name] 09/15/2022 in the atrial fibrillation clinic.  During that time she reported no further episodes of atrial fibrillation.  She chose conservative management and was pending sleep study.  She has started a regular exercise routine, changed her diet, and was losing weight.  She denied palpitations, chest pain and shortness of breath.  She presented to the clinic 09/24/22 for follow-up evaluation and stated she had not felt any more episodes of atrial fibrillation.  We reviewed her cardiac event monitor and her triggered events.  We reviewed her  echocardiogram.  We reviewed her coronary CT.  She reported that her split-night sleep study was not covered by her insurance.  I ordered home sleep study.  Her Epworth sleep score was 14.   I planned follow-up in 6 months.  She was seen in follow-up by Dr. Herbie Baltimore on 04/12/2023.  Her home sleep study had been performed.  There was no evidence of OSA.  She continues to do well from a cardiac standpoint.  She denied cardiac symptoms.  She did note 3-4 episodes in the last 6 months of palpitations.  There were no episodes that lasted more than a minute.  Follow-up was planned for 6 months.  She presents to the clinic today for follow-up evaluation and states***.  Today she denies chest pain, shortness of breath, lower extremity edema, fatigue,  melena, hematuria, hemoptysis, diaphoresis, weakness, presyncope, syncope, orthopnea, and PND.   Home Medications    Prior to Admission medications   Medication Sig Start Date End Date Taking? Authorizing Provider  allopurinol (ZYLOPRIM) 100 MG tablet Take 200 mg by mouth daily. 04/07/21   [provider]  apixaban (ELIQUIS) 5 MG TABS tablet Take 1 tablet (5 mg total) by mouth 2 (two) times daily. 08/20/22 09/19/22  Henderly, Britni A, PA-C  diltiazem (CARDIZEM CD) 120 MG 24 hr capsule Take 1 capsule (120 mg total) by mouth daily. 08/20/22 09/19/22  Henderly, Britni A, PA-C  losartan-hydrochlorothiazide (HYZAAR) 50-12.5 MG tablet Take 1 tablet by mouth daily.  03/15/16   [provider]  pantoprazole (PROTONIX) 40 MG tablet Take 40 mg by mouth daily. 02/13/20   [provider]  SYNTHROID 137 MCG tablet Take 137 mcg by mouth daily. 01/08/20   [provider]    Family History    Family History  Problem Relation Age of Onset   Thyroid disease Mother    Hypertension Mother    Thyroid disease Sister    High Cholesterol Sister    Cancer Father    High Cholesterol Father    Hypertension Brother    High Cholesterol Brother     Breast cancer Maternal Aunt 67   Breast cancer Paternal Grandmother 64   Colon cancer Neg Hx    She indicated that her mother is alive. She indicated that her father is deceased. She indicated that her sister is alive. She indicated that both of her brothers are alive. She indicated that the status of her paternal grandmother is unknown. She indicated that the status of her maternal aunt is unknown. She indicated that the status of her neg hx is unknown.  Social History    Social History   Socioeconomic History   Marital status: Married    Spouse name: Not on file   Number of children: 1   Years of education: Not on file   Highest education level: Bachelor's degree (e.g., BA, AB, BS)  Occupational History    Employer: [employer]    Comment: Engineer, drilling  Tobacco Use   Smoking status: Never   Smokeless tobacco: Never  Vaping Use   Vaping  status: Never Used  Substance and Sexual Activity   Alcohol use: No    Alcohol/week: 0.0 standard drinks of alcohol   Drug use: No   Sexual activity: Not on file  Other Topics Concern   Not on file  Social History Narrative   Married with 1 child.  No grandchildren.  Lives with her spouse.   She is quite active.  Quit smoking in 2003.   Walks 3 miles a day 7 days a week for about an hour 50 minutes a day   Social Drivers of Corporate investment banker Strain: Low Risk  (09/17/2022)   Received from [facility], [facility]   Overall Financial Resource Strain (CARDIA)    Difficulty of Paying Living Expenses: Not very hard  Food Insecurity: No Food Insecurity (09/17/2022)   Received from [facility], [facility]   Hunger Vital Sign    Worried About Running Out of Food in the Last Year: Never true    Ran Out of Food in the Last Year: Never true  Transportation Needs: Not on file  Physical Activity: Sufficiently Active (09/17/2022)   Received from [facility], [facility]   Exercise Vital Sign    Days of  Exercise per Week: 5 days    Minutes of Exercise per Session: 50 min  Stress: Stress Concern Present (09/17/2022)   Received from [facility], [facility] - Occupational Stress Questionnaire    Feeling of Stress : Very much  Social Connections: Socially Integrated (09/17/2022)   Received from [facility], [facility]   Social Network    How would you rate your social network (family, work, friends)?: Good participation with social networks  Intimate Partner Violence: Not At Risk (09/17/2022)   Received from [facility], [facility]   HITS    Over the last 12 months how often did your partner physically hurt you?: Never    Over the last 12 months how often did your partner insult you or talk down to you?: Never    Over the last 12 months how often did your partner threaten you with physical harm?: Never    Over the last 12 months how often did your partner scream or curse at you?: Never     Review of Systems    General:  No chills, fever, night sweats or weight changes.  Cardiovascular:  No chest pain, dyspnea on exertion, edema, orthopnea, palpitations, paroxysmal nocturnal dyspnea. Dermatological: No rash, lesions/masses Respiratory: No cough, dyspnea Urologic: No hematuria, dysuria Abdominal:   No nausea, vomiting, diarrhea, bright red blood per rectum, melena, or hematemesis Neurologic:  No visual changes, wkns, changes in mental status. All other systems reviewed and are otherwise negative except as noted above.  Physical Exam    VS:  There were no vitals taken for this visit. , BMI There is no height or weight on file to calculate BMI. GEN: Well nourished, well developed, in no acute distress. HEENT: normal. Neck: Supple, no JVD, carotid bruits, or masses. Cardiac: RRR, no murmurs, rubs, or gallops. No clubbing, cyanosis, edema.  Radials/DP/PT 2+ and equal bilaterally.  Respiratory:  Respirations regular and unlabored,  clear to auscultation bilaterally. GI: Soft, nontender, nondistended, BS + x 4. MS: no deformity or atrophy. Skin: warm and dry, no rash. Neuro:  Strength and sensation are intact. Psych: Normal affect.  Accessory Clinical Findings    Recent Labs: No results found for requested labs within last 365 days.  Recent Lipid Panel    Component Value Date/Time   CHOL (H) 03/18/2007 0115    224        ATP III CLASSIFICATION:  <200     mg/dL   Desirable  829-562  mg/dL   Borderline High  >=130    mg/dL   High   TRIG 865 78/46/9629 0115   HDL 61 03/18/2007 0115   CHOLHDL 3.7 03/18/2007 0115   VLDL 21 03/18/2007 0115   LDLCALC (H) 03/18/2007 0115    142        Total Cholesterol/HDL:CHD Risk Coronary Heart Disease Risk Table                     Men   Women  1/2 Average Risk   3.4   3.3    No BP recorded.  {Refresh Note OR Click here to enter BP  :1}***    ECG personally reviewed by me today-none today.  EKG 08/24/2022 normal sinus rhythm possible left atrial enlargement 77 bpm no ectopy.  Echocardiogram 03/31/07  LEFT VENTRICLE:   -  Left ventricular size was normal.   -  Overall left ventricular systolic function was normal.   -  Left ventricular ejection fraction was estimated , range being 55         % to 65 %.   -  There were no left ventricular regional wall motion         abnormalities.   -  Left ventricular wall thickness was normal.    AORTIC VALVE:   -  The aortic valve was trileaflet.   -  Aortic valve thickness was normal.   -  There was normal aortic valve leaflet excursion.     Doppler interpretation(s):   -  Transaortic velocity was within the normal range.   -  There was no evidence for aortic valve stenosis.   -  There was no significant aortic valvular regurgitation.    AORTA:   -  The aortic root was normal in size.    MITRAL VALVE:   -  There was mild thickening of the mitral valve.     Doppler interpretation(s):   -  There was trivial mitral  valvular regurgitation.    LEFT ATRIUM:   -  Left atrial size was normal.    RIGHT VENTRICLE:   -  Right ventricular size was normal.   -  Right ventricular systolic function was normal.    PULMONIC VALVE:   Doppler interpretation(s):   -  There was no significant pulmonic regurgitation.    TRICUSPID VALVE:   Doppler interpretation(s):   -  There was trivial tricuspid valvular regurgitation.    PULMONARY ARTERY:   Doppler interpretation(s):   -  The estimated pulmonary artery systolic pressure was within the         normal range.    RIGHT ATRIUM:   -  Right atrial size was normal.    PERICARDIUM:   -  There was no pericardial effusion.     ---------------------------------------------------------------    SUMMARY   -  Overall left ventricular systolic function was normal. Left         ventricular ejection fraction was estimated , range being 55         % to 65 %. There were no left ventricular regional wall         motion abnormalities.    Echocardiogram 09/09/2022  IMPRESSIONS     1.  Left ventricular ejection fraction, by estimation, is 65 to 70%. The  left ventricle has normal function. The left ventricle has no regional  wall motion abnormalities. Left ventricular diastolic parameters were  normal. The average left ventricular  global longitudinal strain is -21.7 %. The global longitudinal strain is  normal.   2. Right ventricular systolic function is normal. The right ventricular  size is normal. There is normal pulmonary artery systolic pressure.   3. Left atrial size was mildly dilated.   4. The mitral valve is normal in structure. Mild mitral valve  regurgitation. No evidence of mitral stenosis.   5. The aortic valve is tricuspid. Aortic valve regurgitation is not  visualized. No aortic stenosis is present.   6. The inferior vena cava is normal in size with greater than 50%  respiratory variability, suggesting right atrial pressure of 3 mmHg.    FINDINGS   Left Ventricle: Left ventricular ejection fraction, by estimation, is 65  to 70%. The left ventricle has normal function. The left ventricle has no  regional wall motion abnormalities. The average left ventricular global  longitudinal strain is -21.7 %.  The global longitudinal strain is normal. The left ventricular internal  cavity size was normal in size. There is no left ventricular hypertrophy.  Left ventricular diastolic parameters were normal.   Right Ventricle: The right ventricular size is normal. No increase in  right ventricular wall thickness. Right ventricular systolic function is  normal. There is normal pulmonary artery systolic pressure. The tricuspid  regurgitant velocity is 2.04 m/s, and   with an assumed right atrial pressure of 3 mmHg, the estimated right  ventricular systolic pressure is 19.6 mmHg.   Left Atrium: Left atrial size was mildly dilated.   Right Atrium: Right atrial size was normal in size.   Pericardium: There is no evidence of pericardial effusion.   Mitral Valve: The mitral valve is normal in structure. Mild mitral valve  regurgitation. No evidence of mitral valve stenosis.   Tricuspid Valve: The tricuspid valve is normal in structure. Tricuspid  valve regurgitation is mild . No evidence of tricuspid stenosis.   Aortic Valve: The aortic valve is tricuspid. Aortic valve regurgitation is  not visualized. No aortic stenosis is present.   Pulmonic Valve: The pulmonic valve was normal in structure. Pulmonic valve  regurgitation is not visualized. No evidence of pulmonic stenosis.   Aorta: The aortic root is normal in size and structure.   Venous: The inferior vena cava is normal in size with greater than 50%  respiratory variability, suggesting right atrial pressure of 3 mmHg.   IAS/Shunts: No atrial level shunt detected by color flow Doppler.   EXAM: CT CARDIAC CORONARY ARTERY CALCIUM SCORE   TECHNIQUE: Non-contrast imaging  through the heart was performed using prospective ECG gating. Image post processing was performed on an independent workstation, allowing for quantitative analysis of the heart and coronary arteries. Note that this exam targets the heart and the chest was not imaged in its entirety.   COMPARISON:  01/20/2018   FINDINGS: CORONARY CALCIUM SCORES:   Left Main: 0   LAD: 0   LCx: 0   RCA: 0   Total Agatston Score: 0   MESA database percentile: 0   AORTA MEASUREMENTS:   Ascending Aorta: 2.8 cm   Descending Aorta: 2.3 cm   OTHER FINDINGS:   Small amount of calcium involving the aortic root. Heart size is normal. Visualized mediastinal structures are normal. Images of the upper abdomen  are unremarkable. Stable tiny nodular density but near the right minor fissure on image 10/6. No airspace disease or consolidation in the visualized lungs. Stable 3 mm nodule in the left lower lobe on image 27/6. Stable mild scarring at the left lung base. Stable tiny nodule in the lingula on image 16/6. No acute bone abnormalities.   IMPRESSION: 1. Coronary calcium score is 0. 2. Stable small pulmonary nodules.     Electronically Signed   By: Richarda Overlie M.D.   On: 08/29/2022 11:30    Cardiac event monitor 08/24/2022  Patch Wear Time:  13 days and 0 hours (2023-12-13T08:46:21-0500 to 2023-12-26T08:59:37-0500)   Patient had a min HR of 46 bpm, max HR of 187 bpm, and avg HR of 71 bpm. Predominant underlying rhythm was Sinus Rhythm. 6 Supraventricular Tachycardia runs occurred, the run with the fastest interval lasting 11 beats with a max rate of 187 bpm, the  longest lasting 12 beats with an avg rate of 127 bpm. Isolated SVEs were rare (<1.0%), SVE Couplets were rare (<1.0%), and SVE Triplets were rare (<1.0%). Isolated VEs were rare (<1.0%), VE Couplets were rare (<1.0%), and no VE Triplets were present.  Ventricular Bigeminy was present.    Assessment & Plan   1.  Atrial  fibrillation, palpitations-heart rate today 6***8 bpm.  Notes only occasional brief episodes of palpitations.  Continues to be compliant with apixaban and denies bleeding issues. This patients CHA2DS2-VASc Score and unadjusted Ischemic Stroke Rate (% per year) is equal to 2.2 % stroke rate/year from a score of 2 [HTN,  Female].  Home sleep study did not show evidence of OSA. Continue current medical therapy Avoid triggers caffeine, chocolate, EtOH, dehydration etc.-reviewed   Essential hypertension-BP today 132***/78.   Continue losartan, HCTZ, diltiazem Heart healthy low-sodium diet Increase physical activity as tolerated  GERD-stable.   Continue GERD diet Continue Protonix  Hypothyroidism-TSH 2.915 on 08/20/2022.  Recommended repeat follow-up labs. Continue Synthroid  Follows with PCP  Disposition: Follow-up with Dr. Herbie Baltimore or me in 9-41months.  Thomasene Ripple. Roslyn Else [name]     10/06/2023, 1:25 PM [facility] [address] Office [phone] Fax [phone]    I spent 14*** minutes examining this patient, reviewing medications, and using patient centered shared decision making involving her cardiac care.  Prior to her visit I spent greater than 20 minutes reviewing her past medical history,  medications, and prior cardiac tests.

## 2023-10-10 ENCOUNTER — Ambulatory Visit: Payer: Managed Care, Other (non HMO) | Attending: General Practice | Admitting: General Practice

## 2023-10-10 ENCOUNTER — Encounter: Payer: Self-pay | Admitting: General Practice

## 2023-10-10 VITALS — BP 136/84 | HR 76 | Ht 66.0 in | Wt 217.0 lb

## 2023-10-10 DIAGNOSIS — K219 Gastro-esophageal reflux disease without esophagitis: Secondary | ICD-10-CM | POA: Diagnosis not present

## 2023-10-10 DIAGNOSIS — I10 Essential (primary) hypertension: Secondary | ICD-10-CM

## 2023-10-10 DIAGNOSIS — E038 Other specified hypothyroidism: Secondary | ICD-10-CM

## 2023-10-10 DIAGNOSIS — I4891 Unspecified atrial fibrillation: Secondary | ICD-10-CM

## 2023-10-10 NOTE — Patient Instructions (Signed)
Medication Instructions:  MAY USE DICLOFENAC GEL FOR ARTHRITIS PAINS *If you need a refill on your cardiac medications before your next appointment, please call your pharmacy*  Lab Work: NONE  Testing/Procedures: NONE  Follow-Up: At [facility], you and your health needs are our priority.  As part of our continuing mission to provide you with exceptional heart care, we have created designated Provider Care Teams.  These Care Teams include your primary Cardiologist (physician) and Advanced Practice Providers (APPs -  Physician Assistants and Nurse Practitioners) who all work together to provide you with the care you need, when you need it.  We recommend signing up for the patient portal called "MyChart".  Sign up information is provided on this After Visit Summary.  MyChart is used to connect with patients for Virtual Visits (Telemedicine).  Patients are able to view lab/test results, encounter notes, upcoming appointments, etc.  Non-urgent messages can be sent to your provider as well.   To learn more about what you can do with MyChart, go to [email or web address].    Your next appointment:   12 month(s)  Provider:   Bryan Lemma, MD     Other Instructions CONTINUE LOW CALORIE DIET

## 2023-10-24 ENCOUNTER — Encounter: Payer: Self-pay | Admitting: Cardiology

## 2023-11-21 MED ORDER — DILTIAZEM HCL ER COATED BEADS 120 MG PO CP24: 120.0000 mg | ORAL_CAPSULE | Freq: Every day | ORAL | 3 refills | Status: DC

## 2023-11-23 MED ORDER — DILTIAZEM HCL 30 MG PO TABS: ORAL_TABLET | ORAL | 2 refills | Status: DC

## 2023-11-23 NOTE — Addendum Note (Signed)
 Addended by: Margaret Pyle D on: 11/23/2023 08:42 AM   Modules accepted: Orders

## 2023-12-07 ENCOUNTER — Ambulatory Visit
Admission: RE | Admit: 2023-12-07 | Discharge: 2023-12-07 | Disposition: A | Discharge status: Home / Self Care | Source: Ambulatory Visit | Attending: Registered Nurse

## 2023-12-07 ENCOUNTER — Other Ambulatory Visit: Payer: Self-pay | Admitting: Registered Nurse

## 2023-12-07 DIAGNOSIS — M79605 Pain in left leg: Secondary | ICD-10-CM

## 2023-12-08 ENCOUNTER — Ambulatory Visit
Admission: RE | Admit: 2023-12-08 | Discharge: 2023-12-08 | Discharge status: Home / Self Care | Payer: Self-pay | Source: Ambulatory Visit | Attending: Registered Nurse

## 2023-12-08 DIAGNOSIS — M79605 Pain in left leg: Secondary | ICD-10-CM

## 2023-12-12 ENCOUNTER — Encounter: Payer: Self-pay | Admitting: Cardiology

## 2024-01-04 ENCOUNTER — Other Ambulatory Visit: Payer: Self-pay

## 2024-01-04 ENCOUNTER — Encounter: Payer: Self-pay | Admitting: Cardiology

## 2024-01-04 MED ORDER — DILTIAZEM HCL 30 MG PO TABS: ORAL_TABLET | ORAL | 3 refills | Status: DC

## 2024-01-04 NOTE — Telephone Encounter (Signed)
 Called and spoke with patient for clarification on her request. Patient states she already received a call and is now able to fill her diltiazem 30 mg Rx. No further needs voiced. Patient expressed appreciation for follow-up.

## 2024-01-10 ENCOUNTER — Encounter: Payer: Self-pay | Admitting: Cardiology

## 2024-02-14 ENCOUNTER — Ambulatory Visit (INDEPENDENT_AMBULATORY_CARE_PROVIDER_SITE_OTHER): Admitting: Psychology

## 2024-02-14 DIAGNOSIS — F4321 Adjustment disorder with depressed mood: Secondary | ICD-10-CM

## 2024-02-14 DIAGNOSIS — F4323 Adjustment disorder with mixed anxiety and depressed mood: Secondary | ICD-10-CM

## 2024-02-14 NOTE — Progress Notes (Signed)
 [facility] Counselor/Therapist Progress Note  Patient ID: [name], MRN: [id number],    Date: 02/14/2024  Time Spent: 60 minutes  Time in:  4:00 Time out:  5:00  Treatment Type: Individual Therapy  Reported Symptoms: sadness, feelings of helplessness, anxiety and anger  Mental Status Exam: Appearance:  Casual     Behavior: Appropriate  Motor: Normal  Speech/Language:  Clear and Coherent  Affect: Blunt  Mood: pleasant  Thought process: normal  Thought content:   WNL  Sensory/Perceptual disturbances:   WNL  Orientation: oriented to person, place, time/date, and situation  Attention: Good  Concentration: Good  Memory: WNL  Fund of knowledge:  Good  Insight:   Good  Judgment:  Good  Impulse Control: Good   Risk Assessment: Danger to Self:  No Self-injurious Behavior: No Danger to Others: No Duty to Warn:no Physical Aggression / Violence:No  Access to Firearms a concern: No  Gang Involvement:No   Subjective: The patient attended a face-to-face individual therapy session in the office today.  The patient presents with a blunted affect and mood is pleasant.  The patient reports that her mother died in 18-Nov-2023 and she reports that she has had difficulty grieving since her mother passed away.  We talked about what it is that she wants to work on and it seems that she wanting to work on some of the same things that we had talked about before.  The patient continues to be driven to do her full-time job and also do her staging job.  We talked about her biggest thing being that she needs to feel safe and she was able to make an connection between her being left in her former marriage with $30,000 worth of debt that she had to pay off and feeling overwhelmed by that.  We talked about her needing to look at what it is she wants to create in her life moving forward.  We talked about having to come up with something that she can look forward to.  The patient is  supposed to sit down and start looking at what kind of plan she would like to have in her retirement and how she would like it to look.  Interventions: Cognitive Behavioral Therapy, Insight-Oriented, Family Systems, and Interpersonal  Diagnosis:Adjustment disorder with mixed anxiety and depressed mood  Grief  Plan:Plan of Care: Treatment Plan Client Abilities/Strengths  Intelligent, motivated, insightful  Client Treatment Preferences  Outpatient Individual therapy  Client Statement of Needs  "Dr. Levie Ream sent me to you because of a trauma I suffered on vacation" Treatment Level  Outpatient Individual therapy  Symptoms  Excessive and/or unrealistic worry that is difficult to control occurring more days than not for at least 6  months about a number of events or activities.: (Status: maintained). Has been  exposed to a traumatic event involving actual or perceived threat of death or serious injury.: (Status: maintained). Hypervigilance (e.g., feeling constantly on edge,  experiencing concentration difficulties, having trouble falling or staying asleep, exhibiting a general  state of irritability).:  (Status: maintained). Impairment in social, occupational,  or other areas of functioning.: (Status: maintained).  Problems Addressed  Posttraumatic Stress Disorder (PTSD),  Adjustment Disorder with mixed anxiety and depression Grief Goals 1. Eliminate or reduce the negative impact trauma related symptoms have  on social, occupational, and family functioning. Objective Participate in Eye Movement Desensitization and Reprocessing (EMDR) to reduce emotional distress  related to traumatic thoughts, feelings, and images. Target Date: 07/06/2024 Frequency:  Bi Weekly  Progress: 0 Modality: individual Related Interventions 1. Utilize Eye Movement Desensitization and Reprocessing (EMDR) to reduce the client's  emotional reactivity to the traumatic event and reduce PTSD symptoms. 2.  Stabilize anxiety level while increasing ability to function on a daily  basis. Objective Learn and implement problem-solving strategies for realistically addressing worries. Target Date: 07/06/2024 Frequency:  BiWeekly Progress: 0 Modality: individual Related Interventions 1. Teach the client problem-solving strategies involving specifically defining a problem,  generating options for addressing it, evaluating the pros and cons of each option, selecting and  implementing an optional action, and reevaluating and refining the action (or assign "Applying  Problem-Solving to Interpersonal Conflict" in the Adult Psychotherapy Homework Planner by  Beacher Bottoms) F43.10 (Posttraumatic stress disorder) - Open - [Signifier: n/a] Posttraumatic Stress  Disorder  Adjustment disorder with mixed anxiety and depression Grief Conditions For Discharge Achievement of treatment goals and objectives   The patient has approved this plan   Shabreka Coulon G Konrad Hoak, LCSW

## 2024-02-21 ENCOUNTER — Ambulatory Visit (INDEPENDENT_AMBULATORY_CARE_PROVIDER_SITE_OTHER): Payer: Self-pay | Admitting: Physician Assistant

## 2024-02-21 ENCOUNTER — Encounter: Payer: Self-pay | Admitting: Physician Assistant

## 2024-02-21 ENCOUNTER — Other Ambulatory Visit (INDEPENDENT_AMBULATORY_CARE_PROVIDER_SITE_OTHER): Payer: Self-pay

## 2024-02-21 ENCOUNTER — Other Ambulatory Visit: Payer: Self-pay

## 2024-02-21 DIAGNOSIS — M25561 Pain in right knee: Secondary | ICD-10-CM | POA: Diagnosis not present

## 2024-02-21 DIAGNOSIS — G8929 Other chronic pain: Secondary | ICD-10-CM

## 2024-02-21 DIAGNOSIS — M25562 Pain in left knee: Secondary | ICD-10-CM

## 2024-02-21 DIAGNOSIS — M1712 Unilateral primary osteoarthritis, left knee: Secondary | ICD-10-CM | POA: Diagnosis not present

## 2024-02-21 DIAGNOSIS — M1711 Unilateral primary osteoarthritis, right knee: Secondary | ICD-10-CM

## 2024-02-21 MED ORDER — LIDOCAINE HCL 1 % IJ SOLN
4.0000 mL | INTRAMUSCULAR | Status: AC | PRN
  Administered 2024-02-21: 4 mL

## 2024-02-21 MED ORDER — METHYLPREDNISOLONE ACETATE 40 MG/ML IJ SUSP
40.0000 mg | INTRAMUSCULAR | Status: AC | PRN
  Administered 2024-02-21: 40 mg via INTRA_ARTICULAR

## 2024-02-21 NOTE — Progress Notes (Signed)
 Office Visit Note   Patient: [name]           Date of Birth: July 19, 1956           MRN: [id number] Visit Date: 02/21/2024              Requested by: Ana Sharps, MD [address] [place],  [place] [address] PCP: Ana Sharps, MD   Assessment & Plan: Visit Diagnoses:  1. Chronic pain of right knee   2. Chronic pain of left knee   3. Unilateral primary osteoarthritis, left knee   4. Unilateral primary osteoarthritis, right knee     Plan: Quad strengthening.  Follow-up with Dr. Alvester James in 2 weeks see what type of response she had to the aspiration injection of the right knee.  Questions were encouraged and answered.  Follow-Up Instructions: Return in about 2 weeks (around 03/06/2024).   Orders:  Orders Placed This Encounter  Procedures   Large Joint Inj: R knee   XR Knee 1-2 Views Right   XR Knee 1-2 Views Left   No orders of the defined types were placed in this encounter.     Procedures: Large Joint Inj: R knee on 02/21/2024 7:08 PM Indications: pain Details: 22 G 1.5 in needle, superolateral approach  Arthrogram: No  Medications: 4 mL lidocaine  1 %; 40 mg methylPREDNISolone  acetate 40 MG/ML Aspirate: 15 mL yellow and blood-tinged Outcome: tolerated well, no immediate complications Procedure, treatment alternatives, risks and benefits explained, specific risks discussed. Consent was given by the patient. Immediately prior to procedure a time out was called to verify the correct patient, procedure, equipment, support staff and site/side marked as required. Patient was prepped and draped in the usual sterile fashion.       Clinical Data: No additional findings.   Subjective: Chief Complaint  Patient presents with   Right Knee - Pain   Left Knee - Pain    HPI [name] 68 year old female comes in today due to knee pain.  She has had increased pain in lower extremities since taking Levaquin in 11/11/23.  She has been also taking care of her mother who  passed away in 2023/11/11.  Currently right knee pain is worse than the left.  Left knee pain has resolved for now and she denies any mechanical symptoms of that knee.  Right knee she is having catching giving way and lateral joint pain.  The pain is worse first thing in the morning.  She has had no injury to either knee.  She is nondiabetic.  Denies any fevers chills.  She is on chronic Eliquis  for A-fib and unable to take NSAIDs.  Review of Systems See HPI   Objective: Vital Signs: There were no vitals taken for this visit.  Physical Exam General Well-developed well-nourished pleasant female in no acute distress.  Ambulates without any assistive device nonantalgic gait. Ortho Exam Bilateral knees: Good range of motion of both knees.  McMurray's negative bilaterally.  No abnormal warmth erythema of either knee.  No instability valgus varus stressing of either knee.  Right knee slight effusion.  Slight patellofemoral crepitus both knees with passive range of motion.   Specialty Comments:  No specialty comments available.  Imaging: Left knee 2 views compared to films from October 2024.  2 views show no significant change.  Moderate medial joint line narrowing.  Significant patellofemoral changes.  No acute fractures or acute findings. Right knee 2 views: No acute fractures findings.  Medial joint line near  bone-on-bone.  And moderate patellofemoral changes.  Lateral joint line well-preserved.  No bony abnormalities otherwise.   PMFS History: Patient Active Problem List   Diagnosis Date Noted   PAF (paroxysmal atrial fibrillation) (HCC) 08/20/2022   Mucoid cyst of joint 11/20/2015   Palpitations 09/04/2015   Hypothyroidism 12/21/2007   Hyperlipidemia 12/21/2007   RHINOSINUSITIS, ALLERGIC 12/21/2007   GERD 12/21/2007   Past Medical History:  Diagnosis Date   GERD (gastroesophageal reflux disease)    Hypothyroid    Acquired.  Has had radiofrequency ablation    Family History   Problem Relation Age of Onset   Thyroid  disease Mother    Hypertension Mother    Thyroid  disease Sister    High Cholesterol Sister    Cancer Father    High Cholesterol Father    Hypertension Brother    High Cholesterol Brother    Breast cancer Maternal Aunt 32   Breast cancer Paternal Grandmother 61   Colon cancer Neg Hx     Past Surgical History:  Procedure Laterality Date   TONSILLECTOMY  1962   UPPER GASTROINTESTINAL ENDOSCOPY     WRIST FRACTURE SURGERY  2002   right wrist   Social History   Occupational History    Employer: [employer]    Comment: Engineer, drilling  Tobacco Use   Smoking status: Never   Smokeless tobacco: Never  Vaping Use   Vaping status: Never Used  Substance and Sexual Activity   Alcohol use: No    Alcohol/week: 0.0 standard drinks of alcohol   Drug use: No   Sexual activity: Not on file

## 2024-02-25 ENCOUNTER — Other Ambulatory Visit: Payer: Self-pay | Admitting: Cardiology

## 2024-02-25 DIAGNOSIS — I4891 Unspecified atrial fibrillation: Secondary | ICD-10-CM

## 2024-02-27 NOTE — Telephone Encounter (Signed)
 Prescription refill request for Eliquis  received. Indication: PAF Last office visit: 10/10/23  Annice Barthel NP Scr: 0.76 on 01/26/24  [facility] Age: 68 Weight: 98.4kg  Based on above findings Eliquis  5mg  twice daily is the appropriate dose.  Refill approved.

## 2024-02-28 ENCOUNTER — Ambulatory Visit: Admitting: Psychology

## 2024-03-06 ENCOUNTER — Other Ambulatory Visit: Payer: Self-pay | Admitting: Radiology

## 2024-03-06 DIAGNOSIS — G8929 Other chronic pain: Secondary | ICD-10-CM

## 2024-03-06 NOTE — Telephone Encounter (Signed)
 MRI ORDERED

## 2024-03-07 ENCOUNTER — Ambulatory Visit
Admission: RE | Admit: 2024-03-07 | Discharge: 2024-03-07 | Disposition: A | Discharge status: Home / Self Care | Source: Ambulatory Visit | Attending: Physician Assistant | Admitting: Physician Assistant

## 2024-03-07 DIAGNOSIS — G8929 Other chronic pain: Secondary | ICD-10-CM

## 2024-03-14 ENCOUNTER — Ambulatory Visit: Payer: Self-pay | Admitting: Psychology

## 2024-03-22 ENCOUNTER — Ambulatory Visit: Admitting: Orthopaedic Surgery

## 2024-03-22 ENCOUNTER — Telehealth: Payer: Self-pay

## 2024-03-22 ENCOUNTER — Encounter: Payer: Self-pay | Admitting: Orthopaedic Surgery

## 2024-03-22 DIAGNOSIS — G8929 Other chronic pain: Secondary | ICD-10-CM

## 2024-03-22 DIAGNOSIS — M25561 Pain in right knee: Secondary | ICD-10-CM

## 2024-03-22 DIAGNOSIS — M1711 Unilateral primary osteoarthritis, right knee: Secondary | ICD-10-CM

## 2024-03-22 NOTE — Telephone Encounter (Signed)
 Right knee gel injection

## 2024-03-22 NOTE — Progress Notes (Signed)
 The patient comes in today in follow-up as a relates to her right knee.  She is 68 years old and actually did run a 10K several months ago.  She been having right knee pain is gotten slowly worse and is mainly on the medial aspect of her knee.  Several weeks ago she did have a steroid injection in her right knee and she said that it only offered a little help.  She says the constant pain is gone but she still cannot do the things that she wants to do.    On exam she has varus malalignment of her right knee that is correctable.  However there is significant medial joint line tenderness.  Plain films of right knee show varus malalignment of the right knee with significant medial joint space narrowing.  The MRI recently of her right knee shows areas of some chondral edema of the weightbearing surface of the medial femoral condyle and medial tibial plateau with significant cartilage thinning.  There are some thinning on the lateral side of her knee.  The medial meniscus also shows degenerative tearing posteriorly.  At this point she is an excellent candidate for trying hyaluronic acid.  This is given the failure of her steroid injection combined with other conservative treatment modalities.  She will still work on quad strengthening exercises and try over-the-counter medication such as turmeric or glucosamine and even Voltaren gel.  We will see her back in follow-up to hopefully try hyaluronic acid for her right knee to treat the pain from osteoarthritis.  This patient is diagnosed with osteoarthritis of the knee(s).    Radiographs show evidence of joint space narrowing, osteophytes, subchondral sclerosis and/or subchondral cysts.  This patient has knee pain which interferes with functional and activities of daily living.    This patient has experienced inadequate response, adverse effects and/or intolerance with conservative treatments such as acetaminophen , NSAIDS, topical creams, physical therapy or  regular exercise, knee bracing and/or weight loss.   This patient has experienced inadequate response or has a contraindication to intra articular steroid injections for at least 3 months.   This patient is not scheduled to have a total knee replacement within 6 months of starting treatment with viscosupplementation.

## 2024-03-27 ENCOUNTER — Encounter: Payer: Self-pay | Admitting: Cardiology

## 2024-03-28 NOTE — Telephone Encounter (Signed)
 VOB submitted for Durolane, right knee  Faxed completed PA form to [employer] at [phone]. PA Pending

## 2024-04-18 ENCOUNTER — Encounter: Payer: Self-pay | Admitting: Cardiology

## 2024-04-18 NOTE — Telephone Encounter (Signed)
 Will try to get something done on my short day this Friday.

## 2024-04-29 NOTE — Telephone Encounter (Signed)
Letter in the chart

## 2024-04-30 NOTE — Telephone Encounter (Signed)
 Sent patient  the  letter as requested.

## 2024-05-18 ENCOUNTER — Other Ambulatory Visit: Payer: Self-pay | Admitting: Cardiology

## 2024-05-31 ENCOUNTER — Encounter: Payer: Self-pay | Admitting: Cardiology

## 2024-07-23 ENCOUNTER — Encounter: Payer: Self-pay | Admitting: Radiology

## 2024-08-13 ENCOUNTER — Other Ambulatory Visit: Payer: Self-pay | Admitting: Cardiology

## 2024-08-13 DIAGNOSIS — I4891 Unspecified atrial fibrillation: Secondary | ICD-10-CM

## 2024-08-13 NOTE — Telephone Encounter (Signed)
 Eliquis  5mg  refill request received. Patient is 68 years old, weight-98.4kg, Crea-0.73 on 04/11/24 via [facility] from [facility], [place], and last seen by Josefa Beauvais on 10/10/23. Dose is appropriate based on dosing criteria. Will send in refill to requested pharmacy.

## 2024-08-14 ENCOUNTER — Encounter: Payer: Self-pay | Admitting: Cardiology

## 2024-08-27 LAB — COLOGUARD: COLOGUARD: NEGATIVE

## 2024-10-10 NOTE — Progress Notes (Unsigned)
 "  Cardiology Office Note    Date:  10/11/2024  ID:  [name], DOB 12-24-1955, MRN [id number] PCP:  Onita Rush, MD  Cardiologist:  Alm Clay, MD  Electrophysiologist:  None   Chief Complaint: Follow up for atrial fibrillation  History of Present Illness: [name] is a 69 y.o. female with visit-pertinent history of PAT/PSVT and hypothyroidism.   Patient has a history of short bursts of PAT and PSVT.  Coronary calcium scoring obtained on 01/20/2018 showed a coronary calcium score of 0.  Previous heart monitor obtained in October 2020 demonstrated mostly sinus rhythm with heart rate between 48-140, average heart rate 70 bpm.  No significant abnormality was detected.  Repeat heart monitor in July 2021 showed bursts of PAT and PACs/PVCs.  Heart rate ranges between 46-131, patient was not placed on scheduled medication due to baseline bradycardia.  She was seen in the ER on 03/26/2021 for palpitation and lasted 45 minutes.  Vagal maneuver was unsuccessful.  By the time she reached the emergency room, symptom has resolved.  Suspected patient had SVT. Ruled out for MI and discharged.   On 08/20/2022 patient presented to the ED found to be in atrial fibrillation, started on diltiazem  drip. Initially planned for TEE/DCCV however patient self converted. She was started on Eliquis , echo and Zio monitored ordered. Repeat calcium scoring in 08/2022 indicated coronary calcium score of 0. Echocardiogram indicated LVEF 65 to 70%, no RWMA, RV systolic function and size normal. Mild mitral valve regurgitation, no evidence of stenosis. Cardiac monitor showed predominant rhythm was normal sinus rhythm, rare PACs and PVCs. Short atrial runs noted. No evidence of afib. Sleep study showed no evidence of OSA.  Patient was last seen in clinic on 10/10/23, she had remained stable from a cardiac standpoint.   Today she presents for follow up. She reports that she has been doing very well overall.  She denies any  chest pain, shortness of breath, lower extremity edema, orthopnea or PND.  She denies any palpitations, presyncope or syncope.  Patient has been doing Pilates for the last year and a half, tolerates very well and enjoys. ROS: .   Today she denies chest pain, shortness of breath, lower extremity edema, fatigue, palpitations, melena, hematuria, hemoptysis, diaphoresis, weakness, presyncope, syncope, orthopnea, and PND.  All other systems are reviewed and otherwise negative. Studies Reviewed: Ana   EKG:  EKG is ordered today, personally reviewed, demonstrating  EKG Interpretation Date/Time:  Thursday October 11 2024 15:18:54 EST Ventricular Rate:  73 PR Interval:  152 QRS Duration:  82 QT Interval:  388 QTC Calculation: 427 R Axis:   7  Text Interpretation: Normal sinus rhythm Normal ECG No significant change was found Confirmed by Kalijah Zeiss [id number]) on 10/11/2024 4:52:26 PM   CV Studies: Cardiac studies reviewed are outlined and summarized above. Otherwise please see EMR for full report. Cardiac Studies & Procedures   ______________________________________________________________________________________________     ECHOCARDIOGRAM  ECHOCARDIOGRAM COMPLETE 09/09/2022  Narrative ECHOCARDIOGRAM REPORT    Patient Name:   [name] Date of Exam: 09/09/2022 Medical Rec #:  [id number]       Height:       66.0 in Accession #:    [id number]      Weight:       203.2 lb Date of Birth:  Sep 21, 1955        BSA:          2.013 m Patient Age:  66 years        BP:           118/86 mmHg Patient Gender: F               HR:           69 bpm. Exam Location:  [address]  Procedure: 2D Echo, Cardiac Doppler, Color Doppler, 3D Echo and Strain Analysis  Indications:    I48.91* Unspecified atrial fibrillation  History:        Patient has prior history of Echocardiogram examinations, most recent 03/31/2007. Risk Factors:Dyslipidemia. Paroxysmal tachycardia. Palpitations.  Sonographer:     Jon Hacker RCS Referring Phys: [phone] DAVID W HARDING  IMPRESSIONS   1. Left ventricular ejection fraction, by estimation, is 65 to 70%. The left ventricle has normal function. The left ventricle has no regional wall motion abnormalities. Left ventricular diastolic parameters were normal. The average left ventricular global longitudinal strain is -21.7 %. The global longitudinal strain is normal. 2. Right ventricular systolic function is normal. The right ventricular size is normal. There is normal pulmonary artery systolic pressure. 3. Left atrial size was mildly dilated. 4. The mitral valve is normal in structure. Mild mitral valve regurgitation. No evidence of mitral stenosis. 5. The aortic valve is tricuspid. Aortic valve regurgitation is not visualized. No aortic stenosis is present. 6. The inferior vena cava is normal in size with greater than 50% respiratory variability, suggesting right atrial pressure of 3 mmHg.  FINDINGS Left Ventricle: Left ventricular ejection fraction, by estimation, is 65 to 70%. The left ventricle has normal function. The left ventricle has no regional wall motion abnormalities. The average left ventricular global longitudinal strain is -21.7 %. The global longitudinal strain is normal. The left ventricular internal cavity size was normal in size. There is no left ventricular hypertrophy. Left ventricular diastolic parameters were normal.  Right Ventricle: The right ventricular size is normal. No increase in right ventricular wall thickness. Right ventricular systolic function is normal. There is normal pulmonary artery systolic pressure. The tricuspid regurgitant velocity is 2.04 m/s, and with an assumed right atrial pressure of 3 mmHg, the estimated right ventricular systolic pressure is 19.6 mmHg.  Left Atrium: Left atrial size was mildly dilated.  Right Atrium: Right atrial size was normal in size.  Pericardium: There is no evidence of pericardial  effusion.  Mitral Valve: The mitral valve is normal in structure. Mild mitral valve regurgitation. No evidence of mitral valve stenosis.  Tricuspid Valve: The tricuspid valve is normal in structure. Tricuspid valve regurgitation is mild . No evidence of tricuspid stenosis.  Aortic Valve: The aortic valve is tricuspid. Aortic valve regurgitation is not visualized. No aortic stenosis is present.  Pulmonic Valve: The pulmonic valve was normal in structure. Pulmonic valve regurgitation is not visualized. No evidence of pulmonic stenosis.  Aorta: The aortic root is normal in size and structure.  Venous: The inferior vena cava is normal in size with greater than 50% respiratory variability, suggesting right atrial pressure of 3 mmHg.  IAS/Shunts: No atrial level shunt detected by color flow Doppler.   LEFT VENTRICLE PLAX 2D LVIDd:         4.60 cm   Diastology LVIDs:         2.90 cm   LV e' medial:    9.90 cm/s LV PW:         0.90 cm   LV E/e' medial:  8.6 LV IVS:        0.90  cm   LV e' lateral:   14.90 cm/s LVOT diam:     1.80 cm   LV E/e' lateral: 5.7 LV SV:         76 LV SV Index:   38        2D Longitudinal Strain LVOT Area:     2.54 cm  2D Strain GLS (A2C):   -24.8 % 2D Strain GLS (A3C):   -20.1 % 2D Strain GLS (A4C):   -20.1 % 2D Strain GLS Avg:     -21.7 %  3D Volume EF: 3D EF:        59 % LV EDV:       154 ml LV ESV:       64 ml LV SV:        91 ml  RIGHT VENTRICLE RV Basal diam:  3.20 cm RV Mid diam:    1.70 cm RV S prime:     13.30 cm/s TAPSE (M-mode): 2.9 cm RVSP:           19.6 mmHg  LEFT ATRIUM           Index        RIGHT ATRIUM           Index LA diam:      4.00 cm 1.99 cm/m   RA Pressure: 3.00 mmHg LA Vol (A2C): 66.1 ml 32.83 ml/m  RA Area:     18.90 cm LA Vol (A4C): 49.7 ml 24.69 ml/m  RA Volume:   52.20 ml  25.93 ml/m AORTIC VALVE LVOT Vmax:   145.00 cm/s LVOT Vmean:  85.300 cm/s LVOT VTI:    0.299 m  AORTA Ao Root diam: 2.80 cm Ao Asc diam:   2.90 cm  MITRAL VALVE                TRICUSPID VALVE MV Area (PHT): 3.31 cm     TR Peak grad:   16.6 mmHg MV Decel Time: 229 msec     TR Vmax:        204.00 cm/s MV E velocity: 85.40 cm/s   Estimated RAP:  3.00 mmHg MV A velocity: 100.00 cm/s  RVSP:           19.6 mmHg MV E/A ratio:  0.85 SHUNTS Systemic VTI:  0.30 m Systemic Diam: 1.80 cm  Annabella Scarce MD Electronically signed by Annabella Scarce MD Signature Date/Time: 09/09/2022/2:19:57 PM    Final    MONITORS  LONG TERM MONITOR (3-14 DAYS) 09/17/2022  Narrative   13days (of 14Day) ZIO Patch Monitor - RESULTS: (12/13-26/2023)   Predominant underlying rhythm was Sinus Rhythm.  Min HR of 46 bpm, max HR of 128 bpm, and avg HR of 71 bpm.   Rare isolated premature atrial contractions PACs (<1 %) noted, with rare couplets and triplets Rare isolated premature ventricular contractions (PVCs) (<1 %) noted, with Rare couplets and triplets; bigeminy also present   6 Atrial Runs: Fastest 11 Beats w/ HR Range 138-187 bpm, Avg 170 bpm, 4.1 sec; Longest 12 Beats w/ HR Range 116-141 bpm, Avg 127 bpm, 5.8 sec   No Sustained Arrhythmias: Atrial Tachycardia (AT), Supraventricular Tachycardia (SVT), Atrial Fibrillation (A-Fib), Atrial Flutter (A-Flutter), Sustained Ventricular Tachycardia (VT)   Patient Triggers: SINUS Rhythm with Isolated PACs, Isolated PVCs (most prominent), & PVCs with Bigeminy/Trigeminy; not with Atrial Runs   Monitor shows mostly sinus rhythm with rare PACs and PVCs.  Short little atrial runs noted. Monitor did not show any evidence  of atrial fibrillation, however ER visit noted A-fib.  This Gladis would suggest that the A-fib burden is not likely significant.   Alm Clay, MD   CT SCANS  CT CARDIAC SCORING (SELF PAY ONLY) 01/20/2018  Narrative CLINICAL DATA:  Hyperlipidemia  EXAM: CT HEART FOR CALCIUM SCORING  TECHNIQUE: CT heart was performed on a 64 channel system using prospective ECG gating.  A  non-contrast exam for calcium scoring was performed.  Note that this exam targets the heart and the chest was not imaged in its entirety.  COMPARISON:  None.  FINDINGS: Technical quality: Good.  CORONARY CALCIUM  Total Agatston Score: 0  MESA database percentile:  0  OTHER FINDINGS:  Cardiovascular: Heart is normal size. Visualized aorta is normal caliber.  Mediastinum/Nodes: No adenopathy in the lower mediastinum or hila.  Lungs/Pleura: Visualized lungs clear.  No effusions.  Upper Abdomen: Imaging into the upper abdomen shows no acute findings.  Musculoskeletal: Chest wall soft tissues are unremarkable. No acute bony abnormality.  IMPRESSION: No visible coronary artery calcifications. Total coronary calcium score of 0.  No acute or significant extracardiac abnormality.   Electronically Signed By: Franky Crease M.D. On: 01/20/2018 13:59     ______________________________________________________________________________________________       Current Reported Medications:.    Active Medications[1]  Physical Exam:    VS:  BP 114/76   Pulse 79   Ht 5' 5 (1.651 m)   Wt 221 lb 9.6 oz (100.5 kg)   SpO2 98%   BMI 36.88 kg/m    Wt Readings from Last 3 Encounters:  10/11/24 221 lb 9.6 oz (100.5 kg)  10/10/23 217 lb (98.4 kg)  08/19/23 200 lb (90.7 kg)    GEN: Well nourished, well developed in no acute distress NECK: No JVD; No carotid bruits CARDIAC: RRR, no murmurs, rubs, gallops RESPIRATORY:  Clear to auscultation without rales, wheezing or rhonchi  ABDOMEN: Soft, non-tender, non-distended EXTREMITIES:  No edema; No acute deformity     Asessement and Plan:.    Afib/Palpitations/PAT/SVT: Patient with prior episodes of SVT and atrial fibrillation, last episode in 2023, started on Eliquis  and diltiazem . EKG today shows normal sinus rhythm.  Patient denies any palpitations or feeling of irregular heartbeats.  Patient denies any bleeding problems on  Eliquis .  Continue diltiazem  120 mg daily, 30 mg as needed for atrial fibrillation.  Continue Eliquis  5 mg twice daily.  Check CBC and be met today.  HTN: Blood pressure today 114/76.  Continue diltiazem  120 mg daily, losartan-HCTZ 50-12.5 mg daily.  Hypothyroidism: Last TSH 3.46.  Monitored and managed per PCP.   Disposition: F/u with Dr. Clay in one year or sooner if needed.   Signed, Channie Bostick D Cyniah Gossard, NP       [1]  Current Meds  Medication Sig   allopurinol (ZYLOPRIM) 100 MG tablet Take 100 mg by mouth daily.   diltiazem  (CARDIZEM  CD) 120 MG 24 hr capsule TAKE 1 CAPSULE (120 MG TOTAL) BY MOUTH DAILY. MAY TAKE EXTRA CAPSULE FOR SUSTAINED PALPITATIONS   diltiazem  (CARDIZEM ) 30 MG tablet TAKE 1 TABLET EVERY 4 HOURS AS NEEDED FOR AFIB HEART RATE >100 AS LONG AS TOP BP >100.   ELIQUIS  5 MG TABS tablet TAKE 1 TABLET BY MOUTH TWICE A DAY   losartan-hydrochlorothiazide (HYZAAR) 50-12.5 MG tablet Take 1 tablet by mouth daily.    SYNTHROID 137 MCG tablet Take 137 mcg by mouth daily.   "

## 2024-10-11 ENCOUNTER — Ambulatory Visit: Attending: Cardiology | Admitting: Cardiology

## 2024-10-11 ENCOUNTER — Encounter: Payer: Self-pay | Admitting: Cardiology

## 2024-10-11 VITALS — BP 114/76 | HR 79 | Ht 65.0 in | Wt 221.6 lb

## 2024-10-11 DIAGNOSIS — I480 Paroxysmal atrial fibrillation: Secondary | ICD-10-CM | POA: Diagnosis not present

## 2024-10-11 DIAGNOSIS — R002 Palpitations: Secondary | ICD-10-CM

## 2024-10-11 DIAGNOSIS — I10 Essential (primary) hypertension: Secondary | ICD-10-CM

## 2024-10-11 DIAGNOSIS — Z79899 Other long term (current) drug therapy: Secondary | ICD-10-CM | POA: Diagnosis not present

## 2024-10-11 LAB — BASIC METABOLIC PANEL WITH GFR
BUN/Creatinine Ratio: 25 (ref 12–28)
BUN: 20 mg/dL (ref 8–27)
CO2: 22 mmol/L (ref 20–29)
Calcium: 9.5 mg/dL (ref 8.7–10.3)
Chloride: 103 mmol/L (ref 96–106)
Creatinine, Ser: 0.81 mg/dL (ref 0.57–1.00)
Glucose: 89 mg/dL (ref 70–99)
Potassium: 4.5 mmol/L (ref 3.5–5.2)
Sodium: 139 mmol/L (ref 134–144)
eGFR: 80 mL/min/1.73

## 2024-10-11 LAB — CBC
Hematocrit: 41.1 % (ref 34.0–46.6)
Hemoglobin: 13.6 g/dL (ref 11.1–15.9)
MCH: 32.4 pg (ref 26.6–33.0)
MCHC: 33.1 g/dL (ref 31.5–35.7)
MCV: 98 fL — ABNORMAL HIGH (ref 79–97)
Platelets: 445 x10E3/uL (ref 150–450)
RBC: 4.2 x10E6/uL (ref 3.77–5.28)
RDW: 13.2 % (ref 11.7–15.4)
WBC: 9 x10E3/uL (ref 3.4–10.8)

## 2024-10-11 NOTE — Patient Instructions (Signed)
 Medication Instructions:  Your physician recommends that you continue on your current medications as directed. Please refer to the Current Medication list given to you today.  *If you need a refill on your cardiac medications before your next appointment, please call your pharmacy*  Lab Work: TODAY: CBC, BMET  If you have labs (blood work) drawn today and your tests are completely normal, you will receive your results only by: MyChart Message (if you have MyChart) OR A paper copy in the mail If you have any lab test that is abnormal or we need to change your treatment, we will call you to review the results.  Testing/Procedures: NONE  Follow-Up: At [facility], you and your health needs are our priority.  As part of our continuing mission to provide you with exceptional heart care, our providers are all part of one team.  This team includes your primary Cardiologist (physician) and Advanced Practice Providers or APPs (Physician Assistants and Nurse Practitioners) who all work together to provide you with the care you need, when you need it.  Your next appointment:   1 year(s)  Provider:   Alm Clay, MD

## 2024-10-12 ENCOUNTER — Ambulatory Visit: Payer: Self-pay | Admitting: Cardiology

## 2024-10-12 NOTE — Progress Notes (Signed)
"   Order(s) created erroneously. Erroneous order ID: [id number]  Order moved by: KIZZIE JERELENE PARAS  Order move date/time: 10/12/2024 12:18 PM  Source Patient: [id number]  Source Contact: 10/11/2024  Destination Patient: [id number]  Destination Contact: 03/02/2023 "
